# Patient Record
Sex: Male | Born: 1965 | Race: Black or African American | Hispanic: No | Marital: Married | Smoking: Never smoker
Health system: Southern US, Community
[De-identification: ages and names within clinical notes are randomized; demographics above are authoritative.]

## PROBLEM LIST (undated history)

## (undated) DIAGNOSIS — J45909 Unspecified asthma, uncomplicated: Secondary | ICD-10-CM

## (undated) DIAGNOSIS — D869 Sarcoidosis, unspecified: Secondary | ICD-10-CM

## (undated) DIAGNOSIS — M3320 Polymyositis, organ involvement unspecified: Secondary | ICD-10-CM

## (undated) DIAGNOSIS — K219 Gastro-esophageal reflux disease without esophagitis: Secondary | ICD-10-CM

## (undated) DIAGNOSIS — M1990 Unspecified osteoarthritis, unspecified site: Secondary | ICD-10-CM

## (undated) DIAGNOSIS — M359 Systemic involvement of connective tissue, unspecified: Secondary | ICD-10-CM

## (undated) DIAGNOSIS — E781 Pure hyperglyceridemia: Secondary | ICD-10-CM

## (undated) HISTORY — PX: VASECTOMY: SHX75

---

## 2007-06-11 ENCOUNTER — Ambulatory Visit: Payer: Self-pay | Admitting: Internal Medicine

## 2007-12-10 ENCOUNTER — Ambulatory Visit: Payer: Self-pay | Admitting: Internal Medicine

## 2008-01-19 ENCOUNTER — Ambulatory Visit: Payer: Self-pay | Admitting: Internal Medicine

## 2009-03-19 ENCOUNTER — Ambulatory Visit: Payer: Self-pay | Admitting: Family Medicine

## 2012-10-13 ENCOUNTER — Ambulatory Visit: Payer: Self-pay | Admitting: Rheumatology

## 2013-03-08 ENCOUNTER — Ambulatory Visit: Payer: Self-pay

## 2013-11-05 DIAGNOSIS — D869 Sarcoidosis, unspecified: Secondary | ICD-10-CM | POA: Insufficient documentation

## 2016-09-25 DIAGNOSIS — Z01810 Encounter for preprocedural cardiovascular examination: Secondary | ICD-10-CM | POA: Diagnosis not present

## 2016-09-25 DIAGNOSIS — R9431 Abnormal electrocardiogram [ECG] [EKG]: Secondary | ICD-10-CM | POA: Diagnosis not present

## 2016-09-25 DIAGNOSIS — Z01812 Encounter for preprocedural laboratory examination: Secondary | ICD-10-CM | POA: Diagnosis present

## 2016-09-25 DIAGNOSIS — E781 Pure hyperglyceridemia: Secondary | ICD-10-CM | POA: Diagnosis not present

## 2016-09-25 NOTE — Pre-Procedure Instructions (Signed)
REVIEWED PT'S PULMONOLOGY NOTE WITH DR Priscella MannPENWARDEN. NO NEW ORDERS.

## 2016-09-25 NOTE — Patient Instructions (Signed)
Your procedure is scheduled on: Thursday 10/04/15 Report to DAY SURGERY. 2ND FLOOR MEDICAL MALL ENTRANCE. To find out your arrival time please call [phone] between 1PM - 3PM on Wednesday 10/03/15.  Remember: Instructions that are not followed completely may result in serious medical risk, up to and including death, or upon the discretion of your surgeon and anesthesiologist your surgery may need to be rescheduled.    __X__ 1. Do not eat food or drink liquids after midnight. No gum chewing or hard candies.     __X__ 2. No Alcohol for 24 hours before or after surgery.   ____ 3. Bring all medications with you on the day of surgery if instructed.    __X__ 4. Notify your doctor if there is any change in your medical condition     (cold, fever, infections).             ___X__5. No smoking within 24 hours of your surgery.     Do not wear jewelry, make-up, hairpins, clips or nail polish.  Do not wear lotions, powders, or perfumes.   Do not shave 48 hours prior to surgery. Men may shave face and neck.  Do not bring valuables to the hospital.    [facility] is not responsible for any belongings or valuables.               Contacts, dentures or bridgework may not be worn into surgery.  Leave your suitcase in the car. After surgery it may be brought to your room.  For patients admitted to the hospital, discharge time is determined by your                treatment team.   Patients discharged the day of surgery will not be allowed to drive home.   Please read over the following fact sheets that you were given:     __X__ Take these medicines the morning of surgery with A SIP OF WATER:    1. OMEPRAZOLE  2.   3.   4.  5.  6.  ____ Fleet Enema (as directed)   ____ Use CHG Soap as directed  __X__ Use inhalers on the day of surgery  (NEB TX)  ____ Stop metformin 2 days prior to surgery    ____ Take 1/2 of usual insulin dose the night before surgery and none on the morning of surgery.    ____ Stop Coumadin/Plavix/aspirin on   __X__ Stop Anti-inflammatories such as Advil, Aleve, Ibuprofen, Motrin, Naproxen, Naprosyn, Goodies,powder, or aspirin products.  OK to take Tylenol.   ____ Stop supplements until after surgery.    ____ Bring C-Pap to the hospital.

## 2016-09-26 ENCOUNTER — Encounter
Admission: RE | Admit: 2016-09-26 | Discharge: 2016-09-26 | Disposition: A | Discharge status: Home / Self Care | Payer: Commercial Managed Care - HMO | Source: Ambulatory Visit | Attending: Orthopedic Surgery | Admitting: Orthopedic Surgery

## 2016-09-26 ENCOUNTER — Other Ambulatory Visit: Payer: Self-pay

## 2016-09-26 DIAGNOSIS — R9431 Abnormal electrocardiogram [ECG] [EKG]: Secondary | ICD-10-CM | POA: Insufficient documentation

## 2016-09-26 DIAGNOSIS — Z01810 Encounter for preprocedural cardiovascular examination: Secondary | ICD-10-CM | POA: Insufficient documentation

## 2016-09-26 DIAGNOSIS — Z01812 Encounter for preprocedural laboratory examination: Secondary | ICD-10-CM | POA: Insufficient documentation

## 2016-09-26 DIAGNOSIS — E781 Pure hyperglyceridemia: Secondary | ICD-10-CM | POA: Insufficient documentation

## 2016-09-26 LAB — CBC
HCT: 43.5 % (ref 40.0–52.0)
Hemoglobin: 14.8 g/dL (ref 13.0–18.0)
MCH: 31.4 pg (ref 26.0–34.0)
MCHC: 34 g/dL (ref 32.0–36.0)
MCV: 92.3 fL (ref 80.0–100.0)
PLATELETS: 256 10*3/uL (ref 150–440)
RBC: 4.71 MIL/uL (ref 4.40–5.90)
RDW: 13.9 % (ref 11.5–14.5)
WBC: 7.3 10*3/uL (ref 3.8–10.6)

## 2016-09-26 NOTE — Pre-Procedure Instructions (Signed)
Dr. Rosita KeaMenz office notified of cardiac clearance request, faxed and [facility]

## 2016-09-26 NOTE — Pre-Procedure Instructions (Signed)
Dr. Karlton LemonKarenz notified of abnormal pre-op EKG, and patient medical history , cardiac clearance requested.

## 2016-10-18 NOTE — Progress Notes (Signed)
Spoke with Lannette DonathKacie at Dr. Rosita KeaMenz office regarding if they had received cardiac clearance. They had left a message with Dr. Darrold JunkerParaschos office about obtaining the cardiac clearance for this patient.

## 2016-10-23 MED ORDER — CEFAZOLIN SODIUM-DEXTROSE 2-4 GM/100ML-% IV SOLN
2.0000 g | Freq: Once | INTRAVENOUS | Status: AC
  Administered 2016-10-24: 2 g via INTRAVENOUS

## 2016-10-24 ENCOUNTER — Ambulatory Visit: Payer: 59 | Admitting: Anesthesiology

## 2016-10-24 ENCOUNTER — Ambulatory Visit
Admission: RE | Admit: 2016-10-24 | Discharge: 2016-10-24 | Disposition: A | Discharge status: Home / Self Care | Payer: 59 | Source: Ambulatory Visit | Attending: Orthopedic Surgery | Admitting: Orthopedic Surgery

## 2016-10-24 ENCOUNTER — Encounter: Payer: Self-pay | Admitting: *Deleted

## 2016-10-24 ENCOUNTER — Encounter
Admission: RE | Disposition: A | Discharge status: Home / Self Care | Payer: Self-pay | Source: Ambulatory Visit | Attending: Orthopedic Surgery

## 2016-10-24 DIAGNOSIS — M1990 Unspecified osteoarthritis, unspecified site: Secondary | ICD-10-CM | POA: Diagnosis not present

## 2016-10-24 DIAGNOSIS — J45909 Unspecified asthma, uncomplicated: Secondary | ICD-10-CM | POA: Insufficient documentation

## 2016-10-24 DIAGNOSIS — K219 Gastro-esophageal reflux disease without esophagitis: Secondary | ICD-10-CM | POA: Diagnosis not present

## 2016-10-24 DIAGNOSIS — E7800 Pure hypercholesterolemia, unspecified: Secondary | ICD-10-CM | POA: Diagnosis not present

## 2016-10-24 DIAGNOSIS — Z79899 Other long term (current) drug therapy: Secondary | ICD-10-CM | POA: Diagnosis not present

## 2016-10-24 DIAGNOSIS — G5601 Carpal tunnel syndrome, right upper limb: Secondary | ICD-10-CM | POA: Diagnosis not present

## 2016-10-24 DIAGNOSIS — D869 Sarcoidosis, unspecified: Secondary | ICD-10-CM | POA: Diagnosis not present

## 2016-10-24 HISTORY — PX: CARPAL TUNNEL RELEASE: SHX101

## 2016-10-24 HISTORY — DX: Unspecified osteoarthritis, unspecified site: M19.90

## 2016-10-24 HISTORY — DX: Sarcoidosis, unspecified: D86.9

## 2016-10-24 HISTORY — DX: Pure hyperglyceridemia: E78.1

## 2016-10-24 HISTORY — DX: Systemic involvement of connective tissue, unspecified: M35.9

## 2016-10-24 HISTORY — DX: Gastro-esophageal reflux disease without esophagitis: K21.9

## 2016-10-24 HISTORY — DX: Unspecified asthma, uncomplicated: J45.909

## 2016-10-24 SURGERY — CARPAL TUNNEL RELEASE
Anesthesia: General | Site: Wrist | Laterality: Right | Wound class: Clean

## 2016-10-24 MED ORDER — BUPIVACAINE HCL 0.5 % IJ SOLN
INTRAMUSCULAR | Status: DC | PRN
  Administered 2016-10-24: 10 mL

## 2016-10-24 MED ORDER — BUPIVACAINE HCL (PF) 0.5 % IJ SOLN
INTRAMUSCULAR | Status: AC
  Filled 2016-10-24: qty 30

## 2016-10-24 MED ORDER — PROPOFOL 10 MG/ML IV BOLUS
INTRAVENOUS | Status: DC | PRN
  Administered 2016-10-24: 20 mg via INTRAVENOUS
  Administered 2016-10-24: 180 mg via INTRAVENOUS
  Administered 2016-10-24: 20 mg via INTRAVENOUS

## 2016-10-24 MED ORDER — SODIUM CHLORIDE 0.9 % IV SOLN: INTRAVENOUS | Status: DC

## 2016-10-24 MED ORDER — CEFAZOLIN SODIUM-DEXTROSE 2-4 GM/100ML-% IV SOLN
INTRAVENOUS | Status: AC
  Filled 2016-10-24: qty 100

## 2016-10-24 MED ORDER — PROPOFOL 10 MG/ML IV BOLUS
INTRAVENOUS | Status: AC
  Filled 2016-10-24: qty 20

## 2016-10-24 MED ORDER — FENTANYL CITRATE (PF) 100 MCG/2ML IJ SOLN: 25.0000 ug | INTRAMUSCULAR | Status: DC | PRN

## 2016-10-24 MED ORDER — LACTATED RINGERS IV SOLN
INTRAVENOUS | Status: DC
  Administered 2016-10-24 (×3): via INTRAVENOUS

## 2016-10-24 MED ORDER — HYDROCODONE-ACETAMINOPHEN 5-325 MG PO TABS: 1.0000 | ORAL_TABLET | ORAL | Status: DC | PRN

## 2016-10-24 MED ORDER — ONDANSETRON HCL 4 MG PO TABS: 4.0000 mg | ORAL_TABLET | Freq: Four times a day (QID) | ORAL | Status: DC | PRN

## 2016-10-24 MED ORDER — MIDAZOLAM HCL 2 MG/2ML IJ SOLN
INTRAMUSCULAR | Status: DC | PRN
  Administered 2016-10-24: 2 mg via INTRAVENOUS

## 2016-10-24 MED ORDER — PROMETHAZINE HCL 25 MG/ML IJ SOLN: 6.2500 mg | INTRAMUSCULAR | Status: DC | PRN

## 2016-10-24 MED ORDER — METOCLOPRAMIDE HCL 5 MG/ML IJ SOLN: 5.0000 mg | Freq: Three times a day (TID) | INTRAMUSCULAR | Status: DC | PRN

## 2016-10-24 MED ORDER — METOCLOPRAMIDE HCL 10 MG PO TABS: 5.0000 mg | ORAL_TABLET | Freq: Three times a day (TID) | ORAL | Status: DC | PRN

## 2016-10-24 MED ORDER — HYDROCODONE-ACETAMINOPHEN 5-325 MG PO TABS: 1.0000 | ORAL_TABLET | Freq: Four times a day (QID) | ORAL | 0 refills | Status: DC | PRN

## 2016-10-24 MED ORDER — FENTANYL CITRATE (PF) 100 MCG/2ML IJ SOLN
INTRAMUSCULAR | Status: DC | PRN
  Administered 2016-10-24: 50 ug via INTRAVENOUS
  Administered 2016-10-24: 25 ug via INTRAVENOUS

## 2016-10-24 MED ORDER — ONDANSETRON HCL 4 MG/2ML IJ SOLN
INTRAMUSCULAR | Status: DC | PRN
  Administered 2016-10-24: 4 mg via INTRAVENOUS

## 2016-10-24 MED ORDER — MIDAZOLAM HCL 2 MG/2ML IJ SOLN
INTRAMUSCULAR | Status: AC
  Filled 2016-10-24: qty 2

## 2016-10-24 MED ORDER — LIDOCAINE HCL (CARDIAC) 20 MG/ML IV SOLN
INTRAVENOUS | Status: DC | PRN
  Administered 2016-10-24: 60 mg via INTRAVENOUS

## 2016-10-24 MED ORDER — ONDANSETRON HCL 4 MG/2ML IJ SOLN: 4.0000 mg | Freq: Four times a day (QID) | INTRAMUSCULAR | Status: DC | PRN

## 2016-10-24 MED ORDER — FENTANYL CITRATE (PF) 100 MCG/2ML IJ SOLN
INTRAMUSCULAR | Status: AC
  Filled 2016-10-24: qty 2

## 2016-10-24 SURGICAL SUPPLY — 30 items
BANDAGE ACE 3X5.8 VEL STRL LF (GAUZE/BANDAGES/DRESSINGS) ×2 IMPLANT
BNDG ESMARK 4X12 TAN STRL LF (GAUZE/BANDAGES/DRESSINGS) ×2 IMPLANT
CANISTER SUCT 1200ML W/VALVE (MISCELLANEOUS) ×2 IMPLANT
CHLORAPREP W/TINT 26ML (MISCELLANEOUS) ×2 IMPLANT
CUFF TOURN 18 STER (MISCELLANEOUS) IMPLANT
ELECT CAUTERY NDL 2.0 MIC (NEEDLE) IMPLANT
ELECT CAUTERY NEEDLE 2.0 MIC (NEEDLE) IMPLANT
ELECT CAUTERY NEEDLE TIP 1.0 (MISCELLANEOUS)
ELECTRODE CAUTERY NEDL TIP 1.0 (MISCELLANEOUS) IMPLANT
GAUZE PETRO XEROFOAM 1X8 (MISCELLANEOUS) ×2 IMPLANT
GAUZE SPONGE 4X4 12PLY STRL (GAUZE/BANDAGES/DRESSINGS) ×2 IMPLANT
GLOVE BIOGEL PI IND STRL 9 (GLOVE) ×1 IMPLANT
GLOVE BIOGEL PI INDICATOR 9 (GLOVE) ×1
GLOVE SURG SYN 9.0  PF PI (GLOVE) ×1
GLOVE SURG SYN 9.0 PF PI (GLOVE) ×1 IMPLANT
GOWN SRG 2XL LVL 4 RGLN SLV (GOWNS) ×1 IMPLANT
GOWN STRL NON-REIN 2XL LVL4 (GOWNS) ×2
GOWN STRL REUS W/ TWL LRG LVL3 (GOWN DISPOSABLE) ×1 IMPLANT
GOWN STRL REUS W/TWL 2XL LVL3 (GOWN DISPOSABLE) ×2 IMPLANT
GOWN STRL REUS W/TWL LRG LVL3 (GOWN DISPOSABLE) ×2
KIT RM TURNOVER STRD PROC AR (KITS) ×2 IMPLANT
NS IRRIG 500ML POUR BTL (IV SOLUTION) ×2 IMPLANT
PACK EXTREMITY ARMC (MISCELLANEOUS) ×2 IMPLANT
PAD CAST CTTN 4X4 STRL (SOFTGOODS) ×1 IMPLANT
PADDING CAST COTTON 4X4 STRL (SOFTGOODS) ×2
STOCKINETTE 48X4 2 PLY STRL (GAUZE/BANDAGES/DRESSINGS) ×1 IMPLANT
STOCKINETTE STRL 4IN 9604848 (GAUZE/BANDAGES/DRESSINGS) ×2 IMPLANT
SUT ETHILON 4-0 (SUTURE) ×2
SUT ETHILON 4-0 FS2 18XMFL BLK (SUTURE) ×1
SUTURE ETHLN 4-0 FS2 18XMF BLK (SUTURE) ×1 IMPLANT

## 2016-10-24 NOTE — H&P (Signed)
Reviewed paper H+P, will be scanned into chart. No changes noted.  

## 2016-10-24 NOTE — Op Note (Signed)
10/24/2016  8:07 AM  PATIENT:  Michael Bussingavid Chieffo Jr.  51 y.o. male  PRE-OPERATIVE DIAGNOSIS:  CARPAL TUNNEL SYNDROME RIGHT  POST-OPERATIVE DIAGNOSIS:  CARPAL TUNNEL SYNDROME RIGHT  PROCEDURE:  Procedure(s): CARPAL TUNNEL RELEASE (Right)  SURGEON: Leitha SchullerMichael J Damare Serano, MD  ASSISTANTS: None  ANESTHESIA:   general  EBL:  Total I/O In: 450 [I.V.:450] Out: -   BLOOD ADMINISTERED:none  DRAINS: none   LOCAL MEDICATIONS USED:  MARCAINE     SPECIMEN:  No Specimen  DISPOSITION OF SPECIMEN:  PATHOLOGY  COUNTS:  YES  TOURNIQUET:   16 minutes at 250 mmHg, forearm tourniquet  IMPLANTS: None  DICTATION: .Dragon Dictation patient was brought the operating room and after adequate general anesthesia was obtained the right arm was prepped and draped in sterile fashion. A tourniquet was applied to the upper forearm and the arm was prepped and draped in sterile fashion. Appropriate patient identification and timeout procedures were completed and tourniquet was raised to 250 mmHg. A volar incision was made in line with ring metacarpal approximate 2 and half centimeters in length going across the flexion crease secondary to compression being proximal to the flexion crease. The transverse carpal ligament was identified there is a very large thenar muscle accessory muscle that covered almost the whole carpal tunnel this was elevated off the carpal tunnel and the ligament released from distal to proximal proximally it was brought centimeter proximal to the wrist flexion crease where there is hourglass compression after release of this under direct visualization the nerve appeared to have good vascular blush there was slight constriction at that level as well. The nerve appeared intact there is mild flexor tenosynovitis no masses present. The wound was then irrigated and local anesthetic given for postop analgesia and the substantial tissue followed by skin closure with simple interrupted 4-0 nylon skin  closure  PLAN OF CARE: Discharge to home after PACU  PATIENT DISPOSITION:  PACU - hemodynamically stable.

## 2016-10-24 NOTE — Anesthesia Preprocedure Evaluation (Signed)
Anesthesia Evaluation  Patient identified by MRN, date of birth, ID band Patient awake    Reviewed: Allergy & Precautions, H&P , NPO status , Patient's Chart, lab work & pertinent test results, reviewed documented beta blocker date and time   History of Anesthesia Complications Negative for: history of anesthetic complications  Airway Mallampati: III  TM Distance: >3 FB Neck ROM: full    Dental no notable dental hx. (+) Caps   Pulmonary shortness of breath and with exertion, asthma , neg sleep apnea, neg COPD, neg recent URI,  Sarcoidosis          Cardiovascular Exercise Tolerance: Good negative cardio ROS       Neuro/Psych negative neurological ROS  negative psych ROS   GI/Hepatic Neg liver ROS, GERD  Medicated,  Endo/Other  negative endocrine ROS  Renal/GU negative Renal ROS  negative genitourinary   Musculoskeletal   Abdominal   Peds  Hematology negative hematology ROS (+)   Anesthesia Other Findings Past Medical History: No date: Arthritis No date: Connective tissue disease (HCC) No date: GERD (gastroesophageal reflux disease) No date: Hypertriglyceridemia No date: Reactive airway disease No date: Sarcoidosis (HCC)   Reproductive/Obstetrics negative OB ROS                             Anesthesia Physical Anesthesia Plan  ASA: II  Anesthesia Plan: General   Post-op Pain Management:    Induction:   Airway Management Planned:   Additional Equipment:   Intra-op Plan:   Post-operative Plan:   Informed Consent: I have reviewed the patients History and Physical, chart, labs and discussed the procedure including the risks, benefits and alternatives for the proposed anesthesia with the patient or authorized representative who has indicated his/her understanding and acceptance.   Dental Advisory Given  Plan Discussed with: Anesthesiologist, CRNA and Surgeon  Anesthesia  Plan Comments:         Anesthesia Quick Evaluation

## 2016-10-24 NOTE — Transfer of Care (Signed)
Immediate Anesthesia Transfer of Care Note  Patient: Michael BussingDavid Pudwill Jr.  Procedure(s) Performed: Procedure(s): CARPAL TUNNEL RELEASE (Right)  Patient Location: PACU  Anesthesia Type:General  Level of Consciousness: sedated  Airway & Oxygen Therapy: Patient Spontanous Breathing and Patient connected to face mask oxygen  Post-op Assessment: Report given to RN and Post -op Vital signs reviewed and stable  Post vital signs: Reviewed and stable  Last Vitals:  Vitals:   10/24/16 0606 10/24/16 0810  BP: (!) 164/102 (!) 146/99  Pulse: 83 79  Resp: 18 (!) 23  Temp: 36.8 C 36.8 C    Last Pain:  Vitals:   10/24/16 0606  TempSrc: Oral         Complications: No apparent anesthesia complications

## 2016-10-24 NOTE — Anesthesia Procedure Notes (Signed)
Procedure Name: LMA Insertion Date/Time: 10/24/2016 7:30 AM Performed by: Henrietta HooverPOPE, Adriana Quinby Pre-anesthesia Checklist: Patient identified, Emergency Drugs available, Suction available, Patient being monitored and Timeout performed Patient Re-evaluated:Patient Re-evaluated prior to inductionOxygen Delivery Method: Circle system utilized Preoxygenation: Pre-oxygenation with 100% oxygen Intubation Type: IV induction Ventilation: Mask ventilation without difficulty LMA: LMA inserted LMA Size: 5.0 Number of attempts: 1 Placement Confirmation: positive ETCO2 and breath sounds checked- equal and bilateral Tube secured with: Tape Dental Injury: Teeth and Oropharynx as per pre-operative assessment

## 2016-10-24 NOTE — Discharge Instructions (Addendum)
Loosen Ace wrap if fingers swell. Work on finger motion. Pain medicine as directed. Keep dressing clean and dry.    AMBULATORY SURGERY  DISCHARGE INSTRUCTIONS   1) The drugs that you were given will stay in your system until tomorrow so for the next 24 hours you should not:  A) Drive an automobile B) Make any legal decisions C) Drink any alcoholic beverage   2) You may resume regular meals tomorrow.  Today it is better to start with liquids and gradually work up to solid foods.  You may eat anything you prefer, but it is better to start with liquids, then soup and crackers, and gradually work up to solid foods.   3) Please notify your doctor immediately if you have any unusual bleeding, trouble breathing, redness and pain at the surgery site, drainage, fever, or pain not relieved by medication.  Please contact your physician with any problems or Same Day Surgery at [phone], Monday through Friday 6 am to 4 pm, or [place] at [facility] Main number at [phone].  Carpal Tunnel Release, Care After Refer to this sheet in the next few weeks. These instructions provide you with information about caring for yourself after your procedure. Your health care provider may also give you more specific instructions. Your treatment has been planned according to current medical practices, but problems sometimes occur. Call your health care provider if you have any problems or questions after your procedure. What can I expect after the procedure? After your procedure, it is typical to have the following:  Pain.  Numbness.  Tingling.  Swelling.  Stiffness.  Bruising. Follow these instructions at home:  Take medicines only as directed by your health care provider.  There are many different ways to close and cover an incision, including stitches (sutures), skin glue, and adhesive strips. Follow your health care provider's instructions about:  Incision care.  Bandage (dressing)  changes and removal.  Incision closure removal.  Wear a splint or a brace as directed by your surgeon. You may need to do this for 2-3 weeks.  Keep your hand raised (elevated) above the level of your heart while you are resting. Move your fingers often.  Avoid activities that cause hand pain.  Ask your surgeon when you can start to do all of your usual activities again, such as:  Driving.  Returning to work.  Bathing and swimming.  Keep all follow-up visits as directed by your health care provider. This is important. You may need physical therapy for several months to speed healing and regain movement. Contact a health care provider if:  You have drainage, redness, swelling, or pain at your incision site.  You have a fever.  You have chills.  Your pain medicine is not working.  Your symptoms do not go away after 2 months.  Your symptoms go away and then return. Get help right away if:  You have pain or numbness that is getting worse.  Your fingers change color.  You are not able to move your fingers. This information is not intended to replace advice given to you by your health care provider. Make sure you discuss any questions you have with your health care provider. Document Released: 04/05/2005 Document Revised: 02/22/2016 Document Reviewed: 05/04/2014 Elsevier Interactive Patient Education  2017 [employer]

## 2016-10-24 NOTE — Anesthesia Post-op Follow-up Note (Cosign Needed)
Anesthesia QCDR form completed.        

## 2016-10-25 NOTE — Anesthesia Postprocedure Evaluation (Signed)
Anesthesia Post Note  Patient: Rogue BussingDavid Mulrooney Jr.  Procedure(s) Performed: Procedure(s) (LRB): CARPAL TUNNEL RELEASE (Right)  Patient location during evaluation: PACU Anesthesia Type: General Level of consciousness: awake and alert Pain management: pain level controlled Vital Signs Assessment: post-procedure vital signs reviewed and stable Respiratory status: spontaneous breathing, nonlabored ventilation, respiratory function stable and patient connected to nasal cannula oxygen Cardiovascular status: blood pressure returned to baseline and stable Postop Assessment: no signs of nausea or vomiting Anesthetic complications: no     Last Vitals:  Vitals:   10/24/16 0931 10/24/16 0953  BP: (!) 145/96 (!) 151/98  Pulse: 71 74  Resp: 18 18  Temp:      Last Pain:  Vitals:   10/24/16 0953  TempSrc:   PainSc: 0-No pain                 Lenard SimmerAndrew Marshon Bangs

## 2016-10-31 ENCOUNTER — Other Ambulatory Visit: Payer: Self-pay

## 2016-11-21 ENCOUNTER — Encounter: Admission: RE | Payer: Self-pay | Source: Ambulatory Visit

## 2016-11-21 ENCOUNTER — Ambulatory Visit
Admission: RE | Admit: 2016-11-21 | Payer: Commercial Managed Care - HMO | Source: Ambulatory Visit | Admitting: Orthopedic Surgery

## 2016-11-21 SURGERY — CARPAL TUNNEL RELEASE
Anesthesia: Choice | Laterality: Left

## 2017-02-06 ENCOUNTER — Encounter: Payer: Self-pay | Admitting: Podiatry

## 2017-02-06 ENCOUNTER — Ambulatory Visit (INDEPENDENT_AMBULATORY_CARE_PROVIDER_SITE_OTHER): Payer: Commercial Managed Care - HMO | Admitting: Podiatry

## 2017-02-06 ENCOUNTER — Ambulatory Visit (INDEPENDENT_AMBULATORY_CARE_PROVIDER_SITE_OTHER): Payer: Commercial Managed Care - HMO

## 2017-02-06 DIAGNOSIS — M79672 Pain in left foot: Secondary | ICD-10-CM

## 2017-02-06 DIAGNOSIS — M722 Plantar fascial fibromatosis: Secondary | ICD-10-CM | POA: Diagnosis not present

## 2017-02-06 DIAGNOSIS — M79671 Pain in right foot: Secondary | ICD-10-CM

## 2017-02-06 MED ORDER — TRIAMCINOLONE ACETONIDE 10 MG/ML IJ SUSP: 10.0000 mg | Freq: Once | INTRAMUSCULAR | Status: DC

## 2017-02-06 NOTE — Progress Notes (Signed)
Subjective:    Patient ID: Michael Bussingavid Cerrato Jr., male   DOB: 51 y.o.   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       Objective:  Physical Exam  Cardiovascular: Intact distal pulses.   Musculoskeletal: Normal range of motion.  Neurological: He is alert.  Skin: Skin is warm.  Nursing note and vitals reviewed.  neurovascular status intact muscle strength adequate range of motion within normal limits with patient found to have exquisite discomfort in the right plantar fascia at the insertion calcaneus and on the left there is a small 3 x 3 mm nodule in the plantar distal arch that's nonpainful with no color change or other pathology. He does have moderate depression of the arch and is found to have good digital perfusion and well oriented 3     Assessment:   Acute plantar fasciitis right with inflammation with probable small cyst left foot that's not changed at all and is not painful      Plan:    H&P and both conditions were discussed. At this point I injected the plantar fascia right 3 mg Kenalog 5 mill grams Xylocaine instructed on physical therapy heat ice therapy and I went ahead and made him a deep orthotic with a heel of at least 12 mm to provide for functional support of the collapsed arch bilateral and take stress off his feet. Patient will have these dispensed in approximately 3 weeks and also had a fascial brace dispensed with instructions on usage  X-rays indicated no spur formation currently with moderate depression of the arch and no arthritis noted

## 2017-02-06 NOTE — Patient Instructions (Signed)

## 2017-02-27 ENCOUNTER — Ambulatory Visit: Payer: Commercial Managed Care - HMO | Admitting: Podiatry

## 2017-02-28 ENCOUNTER — Ambulatory Visit (INDEPENDENT_AMBULATORY_CARE_PROVIDER_SITE_OTHER): Payer: Commercial Managed Care - HMO | Admitting: Podiatry

## 2017-02-28 ENCOUNTER — Encounter: Payer: Self-pay | Admitting: Podiatry

## 2017-02-28 DIAGNOSIS — M722 Plantar fascial fibromatosis: Secondary | ICD-10-CM

## 2017-02-28 MED ORDER — TRIAMCINOLONE ACETONIDE 10 MG/ML IJ SUSP
10.0000 mg | Freq: Once | INTRAMUSCULAR | Status: AC
  Administered 2017-02-28: 10 mg

## 2017-02-28 NOTE — Patient Instructions (Signed)

## 2017-02-28 NOTE — Progress Notes (Signed)
Subjective:    Patient ID: Michael Bussingavid Cadet Jr., male   DOB: 51 y.o.   MRN: [id number]   HPI patient states he still having trouble with his heel when walking but it has improved some and the orthotics feel good    ROS      Objective:  Physical Exam  Neurovascular status intact muscle strength adequate with patient's right heel quite improved with inflammation still noted around the medial band    Assessment:    Plantar fasciitis right improved but present     Plan:    Reinjected the plantar fascia right 3 mg Kenalog 5 mg Xylocaine dispensed orthotics with all instructions on usage

## 2020-01-29 ENCOUNTER — Encounter (HOSPITAL_COMMUNITY): Payer: Self-pay | Admitting: Physician Assistant

## 2020-01-29 ENCOUNTER — Ambulatory Visit (HOSPITAL_COMMUNITY)
Admission: EM | Admit: 2020-01-29 | Discharge: 2020-01-29 | Disposition: A | Discharge status: Home / Self Care | Payer: 59 | Attending: Physician Assistant | Admitting: Physician Assistant

## 2020-01-29 ENCOUNTER — Other Ambulatory Visit: Payer: Self-pay

## 2020-01-29 DIAGNOSIS — L03031 Cellulitis of right toe: Secondary | ICD-10-CM

## 2020-01-29 MED ORDER — SULFAMETHOXAZOLE-TRIMETHOPRIM 800-160 MG PO TABS: 1.0000 | ORAL_TABLET | Freq: Two times a day (BID) | ORAL | 0 refills | Status: AC

## 2020-01-29 NOTE — Discharge Instructions (Addendum)
Take medication as prescribed.

## 2020-01-29 NOTE — ED Triage Notes (Signed)
Ingrown toenail to R foot

## 2020-01-29 NOTE — ED Provider Notes (Signed)
[place]    CSN: [id number] Arrival date & time: 01/29/20  1006      History   Chief Complaint Chief Complaint  Patient presents with  . Ingrown Toenail    HPI Michael Alexander. is a 54 y.o. male.   Here c/w "ingrown toenail" R great toe x 1 week.  He had pedicure done 2 weeks ago, noted swelling shortly after. Admits pain, tenderness, denies erythema, discharge.  He has not taken any medication for this.     Past Medical History:  Diagnosis Date  . Arthritis   . Connective tissue disease ([place])   . GERD (gastroesophageal reflux disease)   . Hypertriglyceridemia   . Reactive airway disease   . Sarcoidosis     There are no problems to display for this patient.   Past Surgical History:  Procedure Laterality Date  . CARPAL TUNNEL RELEASE Right 10/24/2016   Procedure: CARPAL TUNNEL RELEASE;  Surgeon: Hessie Knows, MD;  Location: [facility] ORS;  Service: Orthopedics;  Laterality: Right;  Marland Kitchen VASECTOMY         Home Medications    Prior to Admission medications   Medication Sig Start Date End Date Taking? Authorizing Provider  albuterol (PROVENTIL HFA;VENTOLIN HFA) 108 (90 Base) MCG/ACT inhaler Inhale 1-2 puffs into the lungs every 6 (six) hours as needed for wheezing or shortness of breath.    [provider]  Aspirin-Salicylamide-Caffeine (BC HEADACHE POWDER PO) Take 1 packet by mouth daily as needed (headache).    [provider]  Doxylamine Succinate, Sleep, (SLEEP AID PO) Take 2 tablets by mouth at bedtime as needed (sleep).    [provider]  HYDROcodone-acetaminophen (NORCO) 5-325 MG tablet Take 1-2 tablets by mouth every 6 (six) hours as needed for moderate pain. 10/24/16   Hessie Knows, MD  omeprazole (PRILOSEC) 20 MG capsule Take 20 mg by mouth daily.    [provider]  predniSONE (DELTASONE) 5 MG tablet Take 5-20 mg by mouth daily with breakfast. Take 20 mgs daily for 7 days, then 15mg s daily for 7 days, then 10mg s  for 7 days, then 5mg s daily for 7 days    [provider]  sulfamethoxazole-trimethoprim (BACTRIM DS) 800-160 MG tablet Take 1 tablet by mouth 2 (two) times daily for 10 days. 01/29/20 02/08/20  Peri Jefferson, PA-C    Family History History reviewed. No pertinent family history.  Social History Social History   Tobacco Use  . Smoking status: Never Smoker  . Smokeless tobacco: Never Used  Substance Use Topics  . Alcohol use: Yes    Comment: occ  . Drug use: No     Allergies   Patient has no known allergies.   Review of Systems Review of Systems  Constitutional: Negative for chills, fatigue and fever.  Gastrointestinal: Negative for abdominal pain, nausea and vomiting.  Musculoskeletal: Positive for gait problem. Negative for arthralgias, back pain and joint swelling.  Skin: Negative for color change, rash and wound.  Neurological: Negative for weakness, numbness and headaches.  Hematological: Negative for adenopathy. Does not bruise/bleed easily.  Psychiatric/Behavioral: Negative for confusion and sleep disturbance.  All other systems reviewed and are negative.    Physical Exam Triage Vital Signs ED Triage Vitals [01/29/20 1030]  Enc Vitals Group     BP (!) 166/106     Pulse Rate 87     Resp 16     Temp 98.5 F (36.9 C)     Temp Source Oral  SpO2 99 %     Weight      Height      Head Circumference      Peak Flow      Pain Score 5     Pain Loc      Pain Edu?      Excl. in GC?    No data found.  Updated Vital Signs BP (!) 166/106   Pulse 87   Temp 98.5 F (36.9 C) (Oral)   Resp 16   SpO2 99%   Visual Acuity Right Eye Distance:   Left Eye Distance:   Bilateral Distance:    Right Eye Near:   Left Eye Near:    Bilateral Near:     Physical Exam Vitals and nursing note reviewed.  Constitutional:      General: He is not in acute distress.    Appearance: Normal appearance. He is well-developed. He is not ill-appearing or  toxic-appearing.  HENT:     Head: Normocephalic and atraumatic.     Nose: Nose normal. No congestion.  Eyes:     General: No scleral icterus.    Conjunctiva/sclera: Conjunctivae normal.  Pulmonary:     Effort: Pulmonary effort is normal. No respiratory distress.  Musculoskeletal:     Cervical back: Normal range of motion and neck supple. No rigidity.     Right foot: Normal range of motion. Swelling and tenderness present. No deformity, laceration or bony tenderness. Normal pulse.     Comments: 5 mm x 10 mm firm, tender nodule lateral aspect R great toenail, no erythema, no purulent d/c.    Skin:    General: Skin is warm and dry.     Capillary Refill: Capillary refill takes less than 2 seconds.  Neurological:     General: No focal deficit present.     Mental Status: He is alert and oriented to person, place, and time.     Cranial Nerves: No cranial nerve deficit.     Gait: Gait abnormal (antalgic, favoring L foot).  Psychiatric:        Mood and Affect: Mood normal.        Behavior: Behavior normal.      UC Treatments / Results  Labs (all labs ordered are listed, but only abnormal results are displayed) Labs Reviewed - No data to display  EKG   Radiology No results found.  Procedures Procedures (including critical care time)  Medications Ordered in UC Medications - No data to display  Initial Impression / Assessment and Plan / UC Course  I have reviewed the triage vital signs and the nursing notes.  Pertinent labs & imaging results that were available during my care of the patient were reviewed by me and considered in my medical decision making (see chart for details).     Paronychia too small to lance, will treat with bactrim and advise warm compress on toe. Return PRN if no improvement. Final Clinical Impressions(s) / UC Diagnoses   Final diagnoses:  Paronychia of great toe, right     Discharge Instructions     Take medication as prescribed.    ED  Prescriptions    Medication Sig Dispense Auth. Provider   sulfamethoxazole-trimethoprim (BACTRIM DS) 800-160 MG tablet Take 1 tablet by mouth 2 (two) times daily for 10 days. 20 tablet Evern Core, PA-C     PDMP not reviewed this encounter.   Evern Core, PA-C 01/29/20 1052

## 2020-07-28 DIAGNOSIS — J9859 Other diseases of mediastinum, not elsewhere classified: Secondary | ICD-10-CM | POA: Insufficient documentation

## 2021-02-01 DIAGNOSIS — I2694 Multiple subsegmental pulmonary emboli without acute cor pulmonale: Secondary | ICD-10-CM | POA: Diagnosis present

## 2021-02-05 DIAGNOSIS — J8489 Other specified interstitial pulmonary diseases: Secondary | ICD-10-CM | POA: Insufficient documentation

## 2021-02-10 ENCOUNTER — Emergency Department (HOSPITAL_COMMUNITY): Payer: 59

## 2021-02-10 ENCOUNTER — Other Ambulatory Visit: Payer: Self-pay

## 2021-02-10 ENCOUNTER — Encounter (HOSPITAL_COMMUNITY): Payer: Self-pay | Admitting: Emergency Medicine

## 2021-02-10 ENCOUNTER — Emergency Department (HOSPITAL_COMMUNITY)
Admission: EM | Admit: 2021-02-10 | Discharge: 2021-02-10 | Disposition: A | Discharge status: Home / Self Care | Payer: 59 | Attending: Emergency Medicine | Admitting: Emergency Medicine

## 2021-02-10 DIAGNOSIS — Z79899 Other long term (current) drug therapy: Secondary | ICD-10-CM | POA: Diagnosis not present

## 2021-02-10 DIAGNOSIS — R0781 Pleurodynia: Secondary | ICD-10-CM

## 2021-02-10 DIAGNOSIS — Z7901 Long term (current) use of anticoagulants: Secondary | ICD-10-CM | POA: Insufficient documentation

## 2021-02-10 DIAGNOSIS — I2694 Multiple subsegmental pulmonary emboli without acute cor pulmonale: Secondary | ICD-10-CM | POA: Insufficient documentation

## 2021-02-10 LAB — BASIC METABOLIC PANEL
Anion gap: 6 (ref 5–15)
BUN: 11 mg/dL (ref 6–20)
CO2: 29 mmol/L (ref 22–32)
Calcium: 9.3 mg/dL (ref 8.9–10.3)
Chloride: 103 mmol/L (ref 98–111)
Creatinine, Ser: 1.03 mg/dL (ref 0.61–1.24)
GFR, Estimated: >60 mL/min (ref >60)
Glucose, Bld: 115 mg/dL — ABNORMAL HIGH (ref 70–99)
Potassium: 3.8 mmol/L (ref 3.5–5.1)
Sodium: 138 mmol/L (ref 135–145)

## 2021-02-10 LAB — CBC
HCT: 43.4 % (ref 39.0–52.0)
Hemoglobin: 14.6 g/dL (ref 13.0–17.0)
MCH: 31.3 pg (ref 26.0–34.0)
MCHC: 33.6 g/dL (ref 30.0–36.0)
MCV: 93.1 fL (ref 80.0–100.0)
Platelets: 300 10*3/uL (ref 150–400)
RBC: 4.66 MIL/uL (ref 4.22–5.81)
RDW: 12.8 % (ref 11.5–15.5)
WBC: 10.3 10*3/uL (ref 4.0–10.5)
nRBC: 0 % (ref 0.0–0.2)

## 2021-02-10 LAB — TROPONIN I (HIGH SENSITIVITY): Troponin I (High Sensitivity): 3 ng/L (ref <18)

## 2021-02-10 MED ORDER — LIDOCAINE VISCOUS HCL 2 % MT SOLN
15.0000 mL | Freq: Once | OROMUCOSAL | Status: AC
  Administered 2021-02-10: 15 mL via ORAL
  Filled 2021-02-10: qty 15

## 2021-02-10 MED ORDER — ALUM & MAG HYDROXIDE-SIMETH 200-200-20 MG/5ML PO SUSP
30.0000 mL | Freq: Once | ORAL | Status: AC
  Administered 2021-02-10: 30 mL via ORAL
  Filled 2021-02-10: qty 30

## 2021-02-10 NOTE — ED Triage Notes (Incomplete)
Pt reports generalized chest pain worse with chest pain, onset last night. Pt was dx with right PE one week ago and has been taking eliquis since then. Denies shob. Hx of sarcoidosis.

## 2021-02-10 NOTE — ED Provider Notes (Signed)
[facility] EMERGENCY DEPARTMENT Provider Note   CSN: [id number] Arrival date & time: 02/10/21  1041     History Chief Complaint  Patient presents with  . Chest Pain    Michael Alexander. is a 55 y.o. 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  The patient was diagnosed with multiple subsegmental pulmonary emboli on 01/31/2021 at [facility].  He did not have any evidence of cor pulmonale.  Patient was started on Eliquis and has been doing well.  Last night he began having pain which was worse with deep breathing rated at 5 out of 10.  This is new since his diagnosis.  He denies any increased shortness of breath.  He is also been having feelings of gas and feeling like he needs to belch repeatedly.  He was wondering if his pain might be related to gas however because of his recent diagnosis of pulmonary emboli came to the emergency department for further evaluation.  He denies any nausea, vomiting, diaphoresis.  He has no known history of CAD.  HPI     Past Medical History:  Diagnosis Date  . Arthritis   . Connective tissue disease (HCC)   . GERD (gastroesophageal reflux disease)   . Hypertriglyceridemia   . Reactive airway disease   . Sarcoidosis     There are no problems to display for this patient.   Past Surgical History:  Procedure Laterality Date  . CARPAL TUNNEL RELEASE Right 10/24/2016   Procedure: CARPAL TUNNEL RELEASE;  Surgeon: Kennedy Bucker, MD;  Location: [facility] ORS;  Service: Orthopedics;  Laterality: Right;  Marland Kitchen VASECTOMY         No family history on file.  Social History   Tobacco Use  . Smoking status: Never Smoker  . Smokeless tobacco: Never Used  Substance Use Topics  . Alcohol use: Yes    Comment: occ  . Drug use: No    Home Medications Prior to Admission medications   Medication Sig Start Date End Date Taking? Authorizing Provider  albuterol  (PROVENTIL HFA;VENTOLIN HFA) 108 (90 Base) MCG/ACT inhaler Inhale 1-2 puffs into the lungs every 6 (six) hours as needed for wheezing or shortness of breath.   Yes [provider]  apixaban (ELIQUIS) 5 MG TABS tablet Take 5 mg by mouth 2 (two) times daily. 02/08/21  Yes [provider]  fluticasone-salmeterol (ADVAIR) 500-50 MCG/ACT AEPB Inhale 1 puff into the lungs 2 (two) times daily. 01/20/20  Yes [provider]  omeprazole (PRILOSEC) 20 MG capsule Take 20 mg by mouth daily.   Yes [provider]  HYDROcodone-acetaminophen (NORCO) 5-325 MG tablet Take 1-2 tablets by mouth every 6 (six) hours as needed for moderate pain. Patient not taking: No sig reported 10/24/16   Kennedy Bucker, MD    Allergies    Patient has no known allergies.  Review of Systems   Review of Systems Ten systems reviewed and are negative for acute change, except as noted in the HPI.   Physical Exam Updated Vital Signs BP (!) 145/98   Pulse 90   Temp 98 F (36.7 C)   Resp 19   Ht 5\' 8"  (1.727 m)   Wt 96.2 kg   SpO2 98%   BMI 32.23 kg/m   Physical Exam Vitals and nursing note reviewed.  Constitutional:      General: He is not in acute distress.    Appearance: He is  well-developed. He is not diaphoretic.  HENT:     Head: Normocephalic and atraumatic.  Eyes:     General: No scleral icterus.    Conjunctiva/sclera: Conjunctivae normal.  Cardiovascular:     Rate and Rhythm: Normal rate and regular rhythm.     Heart sounds: Normal heart sounds.  Pulmonary:     Effort: Pulmonary effort is normal. No respiratory distress.     Breath sounds: Normal breath sounds.  Abdominal:     Palpations: Abdomen is soft.     Tenderness: There is no abdominal tenderness.  Musculoskeletal:     Cervical back: Normal range of motion and neck supple.  Skin:    General: Skin is warm and dry.  Neurological:     Mental Status: He is alert.  Psychiatric:        Behavior: Behavior normal.      ED Results / Procedures / Treatments   Labs (all labs ordered are listed, but only abnormal results are displayed) Labs Reviewed  BASIC METABOLIC PANEL - Abnormal; Notable for the following components:      Result Value   Glucose, Bld 115 (*)    All other components within normal limits  CBC  TROPONIN I (HIGH SENSITIVITY)    EKG EKG Interpretation  Date/Time:  Saturday Feb 10 2021 10:43:38 EDT Ventricular Rate:  86 PR Interval:  142 QRS Duration: 80 QT Interval:  364 QTC Calculation: 435 R Axis:   -58 Text Interpretation: Normal sinus rhythm Left axis deviation Inferior infarct , age undetermined Anterior infarct , age undetermined Abnormal ECG Confirmed by Marianna Fuss ([id number]) on 02/10/2021 12:42:54 PM   Radiology DG Chest 2 View  Result Date: 02/10/2021 CLINICAL DATA:  Pt c/o mid CP and SOB since last night. Pt denies any other symptoms Nonsmoker EXAM: CHEST - 2 VIEW COMPARISON:  12/10/2007 FINDINGS: Coarse fibrotic changes of both lung bases. No definite superimposed infiltrate or edema. Heart size and mediastinal contours are within normal limits. No effusion.  No pneumothorax. Visualized bones unremarkable. IMPRESSION: Interstitial lung disease without definite acute or superimposed abnormality Electronically Signed   By: Corlis Leak M.D.   On: 02/10/2021 11:45    Procedures Procedures   Medications Ordered in ED Medications  alum & mag hydroxide-simeth (MAALOX/MYLANTA) 200-200-20 MG/5ML suspension 30 mL (30 mLs Oral Given 02/10/21 1223)    And  lidocaine (XYLOCAINE) 2 % viscous mouth solution 15 mL (15 mLs Oral Given 02/10/21 1223)    ED Course  I have reviewed the triage vital signs and the nursing notes.  Pertinent labs & imaging results that were available during my care of the patient were reviewed by me and considered in my medical decision making (see chart for details).    MDM Rules/Calculators/A&P                         55 year old male here  with known pulmonary emboli.  He had an increase in his pleuritic chest pain but is on anticoagulation therapy and is compliant with medications.  Other than mild hypertension the patient has excellent vital signs which are within normal limits without tachypnea, tachycardia.  I ordered and reviewed labs that include BMP which shows a slightly elevated blood glucose of insignificant value CBC is otherwise within normal limit with a normal troponin.  I repeated a chest x-ray which shows evidence of his residual lung disease but no other acute abnormalities. EKG shows sinus rhythm at a rate of  86.  Patient seen and shared visit with Dr. Stevie Kern.  In shared decision-making with the patient, his wife do not feel that the patient needs repeat imaging at this time given the fact that he is on appropriate treatment and there is no evidence of significant increase in his work of breathing, his vital signs and no elevation in his troponin suggesting heart strain.  The patient has no exertional dyspnea.  At this time we feel that he is safe to follow very closely with his pulmonologist and cardiologist this coming week at [facility] with very strict return precautions.  The patient and his wife seem reliable to return for any change in condition of concern.  He appears otherwise appropriate for discharge at this time.  This patient was evaluated during a time of global shortage of iodinated contrast media. Based on guidance from the [employer] of Radiology, best practices, and local institutional approaches an alternative path for evaluating and managing the patient may have been employed in order to provide optimal care during this shortage. The current situation has been discussed with the patient. Final Clinical Impression(s) / ED Diagnoses Final diagnoses:  Pleuritic chest pain  Multiple subsegmental pulmonary emboli without acute cor pulmonale [facility])    Rx / DC Orders ED Discharge Orders    None        Arthor Captain, PA-C 02/10/21 1315    Milagros Loll, MD 02/12/21 [phone]

## 2021-02-10 NOTE — ED Triage Notes (Signed)
Upper chest pain, SHOB and indegestion this am; no relief with TUMS. Was seen at [facility] on 5/4 and dx with PE. Takes eliquis.

## 2021-02-10 NOTE — Discharge Instructions (Addendum)
Get help right away if you: Have: New or increased pain, swelling, warmth, or redness in an arm or leg. Shortness of breath that gets worse during activity or at rest. A fever. Worsening chest pain. A rapid or irregular heartbeat. A severe headache. Vision changes. A serious fall or accident, or you hit your head. Stomach pain. Blood in your vomit, stool, or urine. A cut that will not stop bleeding. Cough up blood. Feel light-headed or dizzy, and that feeling does not go away. Cannot move your arms or legs. Are confused or have memory loss.

## 2021-08-02 DIAGNOSIS — D689 Coagulation defect, unspecified: Secondary | ICD-10-CM | POA: Insufficient documentation

## 2021-12-20 ENCOUNTER — Encounter (HOSPITAL_COMMUNITY): Payer: Self-pay | Admitting: Pharmacy Technician

## 2021-12-20 ENCOUNTER — Other Ambulatory Visit: Payer: Self-pay

## 2021-12-20 ENCOUNTER — Encounter (HOSPITAL_COMMUNITY): Payer: Self-pay | Admitting: *Deleted

## 2021-12-20 ENCOUNTER — Emergency Department (HOSPITAL_COMMUNITY): Payer: 59

## 2021-12-20 ENCOUNTER — Emergency Department (HOSPITAL_COMMUNITY)
Admission: EM | Admit: 2021-12-20 | Discharge: 2021-12-20 | Disposition: A | Discharge status: Home / Self Care | Payer: 59 | Attending: Emergency Medicine | Admitting: Emergency Medicine

## 2021-12-20 ENCOUNTER — Ambulatory Visit (INDEPENDENT_AMBULATORY_CARE_PROVIDER_SITE_OTHER): Payer: 59

## 2021-12-20 ENCOUNTER — Ambulatory Visit (HOSPITAL_COMMUNITY)
Admission: EM | Admit: 2021-12-20 | Discharge: 2021-12-20 | Disposition: A | Discharge status: Home / Self Care | Payer: 59 | Source: Home / Self Care

## 2021-12-20 DIAGNOSIS — R051 Acute cough: Secondary | ICD-10-CM | POA: Insufficient documentation

## 2021-12-20 DIAGNOSIS — J189 Pneumonia, unspecified organism: Secondary | ICD-10-CM | POA: Diagnosis not present

## 2021-12-20 DIAGNOSIS — R059 Cough, unspecified: Secondary | ICD-10-CM | POA: Diagnosis not present

## 2021-12-20 DIAGNOSIS — R000 Tachycardia, unspecified: Secondary | ICD-10-CM | POA: Diagnosis not present

## 2021-12-20 DIAGNOSIS — Z20822 Contact with and (suspected) exposure to covid-19: Secondary | ICD-10-CM | POA: Insufficient documentation

## 2021-12-20 DIAGNOSIS — Z7901 Long term (current) use of anticoagulants: Secondary | ICD-10-CM | POA: Insufficient documentation

## 2021-12-20 DIAGNOSIS — D869 Sarcoidosis, unspecified: Secondary | ICD-10-CM | POA: Insufficient documentation

## 2021-12-20 DIAGNOSIS — Z86711 Personal history of pulmonary embolism: Secondary | ICD-10-CM | POA: Insufficient documentation

## 2021-12-20 DIAGNOSIS — R0602 Shortness of breath: Secondary | ICD-10-CM

## 2021-12-20 LAB — BASIC METABOLIC PANEL
Anion gap: 5 (ref 5–15)
BUN: 6 mg/dL (ref 6–20)
CO2: 28 mmol/L (ref 22–32)
Calcium: 9.2 mg/dL (ref 8.9–10.3)
Chloride: 105 mmol/L (ref 98–111)
Creatinine, Ser: 1.03 mg/dL (ref 0.61–1.24)
GFR, Estimated: >60 mL/min (ref >60)
Glucose, Bld: 140 mg/dL — ABNORMAL HIGH (ref 70–99)
Potassium: 3.6 mmol/L (ref 3.5–5.1)
Sodium: 138 mmol/L (ref 135–145)

## 2021-12-20 LAB — CBC
HCT: 43.5 % (ref 39.0–52.0)
Hemoglobin: 14.6 g/dL (ref 13.0–17.0)
MCH: 31.3 pg (ref 26.0–34.0)
MCHC: 33.6 g/dL (ref 30.0–36.0)
MCV: 93.3 fL (ref 80.0–100.0)
Platelets: 296 10*3/uL (ref 150–400)
RBC: 4.66 MIL/uL (ref 4.22–5.81)
RDW: 13.3 % (ref 11.5–15.5)
WBC: 8.2 10*3/uL (ref 4.0–10.5)
nRBC: 0 % (ref 0.0–0.2)

## 2021-12-20 LAB — POC INFLUENZA A AND B ANTIGEN (URGENT CARE ONLY)
INFLUENZA A ANTIGEN, POC: NEGATIVE
INFLUENZA B ANTIGEN, POC: NEGATIVE

## 2021-12-20 MED ORDER — DOXYCYCLINE HYCLATE 100 MG PO CAPS: 100.0000 mg | ORAL_CAPSULE | Freq: Two times a day (BID) | ORAL | 0 refills | Status: DC

## 2021-12-20 MED ORDER — METHYLPREDNISOLONE SODIUM SUCC 125 MG IJ SOLR
125.0000 mg | Freq: Once | INTRAMUSCULAR | Status: AC
  Administered 2021-12-20: 125 mg via INTRAMUSCULAR

## 2021-12-20 MED ORDER — IOHEXOL 350 MG/ML SOLN
65.0000 mL | Freq: Once | INTRAVENOUS | Status: AC | PRN
  Administered 2021-12-20: 65 mL via INTRAVENOUS

## 2021-12-20 MED ORDER — METHYLPREDNISOLONE SODIUM SUCC 125 MG IJ SOLR
INTRAMUSCULAR | Status: AC
  Filled 2021-12-20: qty 2

## 2021-12-20 MED ORDER — ALBUTEROL SULFATE HFA 108 (90 BASE) MCG/ACT IN AERS
2.0000 | INHALATION_SPRAY | RESPIRATORY_TRACT | 1 refills | Status: AC | PRN
Start: 2021-12-20 — End: ?

## 2021-12-20 NOTE — ED Notes (Signed)
Patient is being discharged from the Urgent Care and sent to the Emergency Department via POV . Per Leath-Warren-, Lorene Dy NP patient is in need of higher level of care due to further evaluation. Patient is aware and verbalizes understanding of plan of care.  ?Vitals:  ? 12/20/21 1036 12/20/21 1121  ?BP: (!) 146/91 (!) 146/91  ?Pulse:  88  ?Resp:  20  ?Temp:  98.7 ?F (37.1 ?C)  ?SpO2:  98%  ? ? ?

## 2021-12-20 NOTE — Discharge Instructions (Addendum)
Go to the [facility], ER for further evaluation.  They will be able to access our records, including the chest x-ray and EKG that were performed today. ?

## 2021-12-20 NOTE — ED Provider Triage Note (Signed)
Emergency Medicine Provider Triage Evaluation Note ? ?Briscoe Deutscher. , a 56 y.o. male  was evaluated in triage.  Pt complains of gradual onset, constant, cough for the past 1.5 weeks.  Reports that he began having some bloody tinged sputum earlier today.  He went to urgent care who performed a chest x-ray which did show a 10 mm nodular density in the right lung.  Recommended contrast-enhanced CT scan for further eval.  Patient does have history of PE.  Currently on Eliquis for same.  Denies missing any doses.  Denies any chest pain or shortness of breath.. ? ?Review of Systems  ?Positive: + cough, blood tinged sputum ?Negative: - SOB ? ?Physical Exam  ?BP (!) 167/107 (BP Location: Right Arm)   Pulse (!) 104   Temp 98.9 ?F (37.2 ?C) (Oral)   Resp (!) 22   SpO2 99%  ?Gen:   Awake, no distress   ?Resp:  Normal effort  ?MSK:   Moves extremities without difficulty  ?Other:   ? ?Medical Decision Making  ?Medically screening exam initiated at 1:28 PM.  Appropriate orders placed.  Briscoe Deutscher. was informed that the remainder of the evaluation will be completed by another provider, this initial triage assessment does not replace that evaluation, and the importance of remaining in the ED until their evaluation is complete. ? ? ?  ?Eustaquio Maize, PA-C ?12/20/21 1329 ? ?

## 2021-12-20 NOTE — ED Provider Notes (Signed)
?[facility] ? ? ? ?CSN: [id number] ?Arrival date & time: 12/20/21  [id number] ? ? ?  ? ?History   ?Chief Complaint ?Chief Complaint  ?Patient presents with  ? Cough  ? ? ?HPI ?Michael BussingDavid Caison Jr. is a 56 y.o. male.  ? ?The patient is a 56 year old male who presents for shortness of breath.  Patient states approximately 1 week ago, he came down with what he thought was the flu.  He developed chills, and a cough at that time.  He also states that he felt his symptoms were exacerbated by his allergies.  The patient states that over the last several days, he has developed worsening shortness of breath.  He states this morning he coughed up blood-tinged sputum.  The patient does have a history of PE, that occurred last year.  He is currently on blood thinners.  He also has a history of sarcoid.  States that he has been using his albuterol inhaler and nebulizer more frequently over the past several days.  He does admit to shortness of breath when talking, on exertion.  He denies chest pain, or inability to speak in a complete sentence.  He states that he [facility] does [facility].  He states he tried to get an appointment, but he cannot be seen until next week. ? ? ?Cough ?Cough characteristics:  Productive ?Sputum characteristics:  Bloody ?Severity:  Moderate ?Progression:  Worsening ?Smoker: no   ?Context: upper respiratory infection   ?Relieved by:  Home nebulizer ?Worsened by:  Deep breathing, activity and lying down ?Associated symptoms: shortness of breath and wheezing   ?Associated symptoms: no chest pain, no fever, no rhinorrhea, no sinus congestion and no sore throat   ? ?Past Medical History:  ?Diagnosis Date  ? Arthritis   ? Connective tissue disease (HCC)   ? GERD (gastroesophageal reflux disease)   ? Hypertriglyceridemia   ? Reactive airway disease   ? Sarcoidosis   ? ? ?There are no problems to display for this patient. ? ? ?Past Surgical History:  ?Procedure Laterality Date  ? CARPAL  TUNNEL RELEASE Right 10/24/2016  ? Procedure: CARPAL TUNNEL RELEASE;  Surgeon: Kennedy BuckerMichael Menz, MD;  Location: [facility] ORS;  Service: Orthopedics;  Laterality: Right;  ? VASECTOMY    ? ? ? ? ? ?Home Medications   ? ?Prior to Admission medications   ?Medication Sig Start Date End Date Taking? Authorizing Provider  ?albuterol (PROVENTIL HFA;VENTOLIN HFA) 108 (90 Base) MCG/ACT inhaler Inhale 1-2 puffs into the lungs every 6 (six) hours as needed for wheezing or shortness of breath.    [provider]  ?apixaban (ELIQUIS) 5 MG TABS tablet Take 5 mg by mouth 2 (two) times daily. 02/08/21   [provider]  ?fluticasone-salmeterol (ADVAIR) 500-50 MCG/ACT AEPB Inhale 1 puff into the lungs 2 (two) times daily. 01/20/20   [provider]  ?HYDROcodone-acetaminophen (NORCO) 5-325 MG tablet Take 1-2 tablets by mouth every 6 (six) hours as needed for moderate pain. ?Patient not taking: No sig reported 10/24/16   Kennedy BuckerMenz, Michael, MD  ?omeprazole (PRILOSEC) 20 MG capsule Take 20 mg by mouth daily.    [provider]  ? ? ?Family History ?History reviewed. No pertinent family history. ? ?Social History ?Social History  ? ?Tobacco Use  ? Smoking status: Never  ? Smokeless tobacco: Never  ?Substance Use Topics  ? Alcohol use: Yes  ?  Comment: occ  ? Drug use: No  ? ? ? ?Allergies   ?  Patient has no known allergies. ? ? ?Review of Systems ?Review of Systems  ?Constitutional:  Positive for fatigue. Negative for fever.  ?HENT:  Negative for rhinorrhea and sore throat.   ?Respiratory:  Positive for cough, shortness of breath and wheezing.   ?Cardiovascular:  Negative for chest pain.  ?Gastrointestinal: Negative.   ?Skin: Negative.   ?Psychiatric/Behavioral: Negative.    ? ? ?Physical Exam ?Triage Vital Signs ?ED Triage Vitals  ?Enc Vitals Group  ?   BP 12/20/21 1036 (!) 146/91  ?   Pulse --   ?   Resp --   ?   Temp --   ?   Temp src --   ?   SpO2 --   ?   Weight --   ?   Height --   ?   Head Circumference --   ?    Peak Flow --   ?   Pain Score 12/20/21 1034 0  ?   Pain Loc --   ?   Pain Edu? --   ?   Excl. in GC? --   ? ?No data found. ? ?Updated Vital Signs ?BP (!) 146/91   Pulse 88   Temp 98.7 ?F (37.1 ?C)   Resp 20   SpO2 98%  ? ?Visual Acuity ?Right Eye Distance:   ?Left Eye Distance:   ?Bilateral Distance:   ? ?Right Eye Near:   ?Left Eye Near:    ?Bilateral Near:    ? ?Physical Exam ?Constitutional:   ?   General: He is not in acute distress. ?   Appearance: Normal appearance.  ?HENT:  ?   Head: Normocephalic and atraumatic.  ?   Right Ear: Tympanic membrane, ear canal and external ear normal.  ?   Left Ear: Tympanic membrane, ear canal and external ear normal.  ?   Nose: Congestion present.  ?   Mouth/Throat:  ?   Mouth: Mucous membranes are moist.  ?   Pharynx: Posterior oropharyngeal erythema present. No oropharyngeal exudate.  ?Eyes:  ?   Extraocular Movements: Extraocular movements intact.  ?   Conjunctiva/sclera: Conjunctivae normal.  ?   Pupils: Pupils are equal, round, and reactive to light.  ?Pulmonary:  ?   Effort: Pulmonary effort is normal. No respiratory distress.  ?Abdominal:  ?   General: Bowel sounds are normal.  ?   Palpations: Abdomen is soft.  ?   Tenderness: There is no abdominal tenderness.  ?Musculoskeletal:  ?   Cervical back: Normal range of motion and neck supple.  ?Skin: ?   General: Skin is warm and dry.  ?   Capillary Refill: Capillary refill takes less than 2 seconds.  ?Neurological:  ?   General: No focal deficit present.  ?   Mental Status: He is alert and oriented to person, place, and time.  ?Psychiatric:     ?   Mood and Affect: Mood normal.     ?   Behavior: Behavior normal.  ? ? ? ?UC Treatments / Results  ?Labs ?(all labs ordered are listed, but only abnormal results are displayed) ?Labs Reviewed  ?SARS CORONAVIRUS 2 (TAT 6-24 HRS)  ?POC INFLUENZA A AND B ANTIGEN (URGENT CARE ONLY)  ? ? ?EKG ?NSR, Left axis deviation, inferior infarct, age undetermined, anterior infarct, age  undetermined. Abnormal ECG. Compared to EKG performed 02/12/21. No changes noted.  ? ?Radiology ?DG Chest 2 View ? ?Result Date: 12/20/2021 ?CLINICAL DATA:  cough and SOB EXAM: CHEST - 2  VIEW COMPARISON:  02/10/2021 FINDINGS: Cardiomediastinal silhouette and pulmonary vasculature are within normal limits. Increased interstitial opacities again seen at the lung base. The given stability since prior examination this is most consistent with scarring. Interval development of 10 mm nodular opacity at the right lateral lung base. Degenerative changes seen in the lower cervical spine. IMPRESSION: 1. No acute cardiopulmonary process. 2. Unchanged linear opacities at the lung bases most consistent with scarring. 3. Interval development of 10 mm nodular opacity at the right lateral lung base. Further evaluation with contrast enhanced chest CT should be performed. These results will be called to the ordering clinician or representative by the Radiologist Assistant, and communication documented in the PACS or [employer]. Electronically Signed   By: Acquanetta Belling M.D.   On: 12/20/2021 10:57   ? ?Procedures ?Procedures (including critical care time) ? ?Medications Ordered in UC ?Medications  ?methylPREDNISolone sodium succinate (SOLU-MEDROL) 125 mg/2 mL injection 125 mg (has no administration in time range)  ? ? ?Initial Impression / Assessment and Plan / UC Course  ?I have reviewed the triage vital signs and the nursing notes. ? ?Pertinent labs & imaging results that were available during my care of the patient were reviewed by me and considered in my medical decision making (see chart for details). ? ?The patient is a 56 year old male who presents with worsening cough and shortness of breath over the last week.  Patient's symptoms started with flulike symptoms, but cough has since worsened.  The patient has a history of sarcoid and PE.  He currently takes Eliquis.  Today, he states he coughed up blood-tinged sputum.  He is  concerned because he is having to use his albuterol inhaler and nebulizer treatment frequently.  Chest x-ray was performed, which showed a new interval development of a 10 mm opacity.  Radiologist recommended the

## 2021-12-20 NOTE — Discharge Instructions (Signed)
1.  There was no evidence of a blood clot in your lungs on your CT scan today.  You do need to review the results of your CT scan with your pulmonologist to discuss long-term management and surveillance of chronic appearing conditions and a small nodule.  Continue your other medications including your Xarelto as prescribed. ?2.  Start taking doxycycline as prescribed.  Continue use your albuterol inhaler as needed. ?3.  Return to emergency department immediately if you have worsening shortness of breath, get a fever and chest pain or other concerning symptoms. ?

## 2021-12-20 NOTE — ED Provider Notes (Signed)
?[facility] EMERGENCY DEPARTMENT ?Provider Note ? ? ?CSN: [id number] ?Arrival date & time: 12/20/21  1250 ? ?  ? ?History ? ?No chief complaint on file. ? ? ?Michael Coor. is a 56 y.o. male. ? ?HPI ?Patient is sent from urgent care for further evaluation by CT scan.  There was a concerning finding on chest x-ray with radiology recommendation for follow-up CT.  She has history of sarcoidosis.  He has had a harsh cough for a week.  This morning he started coughing up some blood.  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  No lower extremity swelling or calf pain throughout the course of this.  He has felt somewhat short of breath.  He denies chest pain.  He has not had any antibiotic therapy during the course of this illness.  Has used his albuterol inhaler more frequently over the past week. ?  ? ?Home Medications ?Prior to Admission medications   ?Medication Sig Start Date End Date Taking? Authorizing Provider  ?acetaminophen (TYLENOL) 325 MG tablet Take 325-650 mg by mouth every 6 (six) hours as needed for headache or mild pain.   Yes [provider]  ?albuterol (PROVENTIL HFA;VENTOLIN HFA) 108 (90 Base) MCG/ACT inhaler Inhale 2 puffs into the lungs every 6 (six) hours as needed for wheezing or shortness of breath.   Yes [provider]  ?albuterol (PROVENTIL) (2.5 MG/3ML) 0.083% nebulizer solution Take 2.5 mg by nebulization every 6 (six) hours as needed for wheezing or shortness of breath.   Yes [provider]  ?albuterol (VENTOLIN HFA) 108 (90 Base) MCG/ACT inhaler Inhale 2 puffs into the lungs every 4 (four) hours as needed for wheezing or shortness of breath. 12/20/21  Yes Delona Clasby, Lebron Conners, MD  ?Aspirin-Salicylamide-Caffeine (BC HEADACHE POWDER PO) Take 1 packet by mouth 2 (two) times daily as needed (for headaches or pain).   Yes [provider]  ?doxycycline (VIBRAMYCIN) 100 MG capsule Take 1 capsule (100 mg total) by mouth 2  (two) times daily. One po bid x 7 days 12/20/21  Yes Denson Niccoli, Lebron Conners, MD  ?fluticasone-salmeterol (ADVAIR) 500-50 MCG/ACT AEPB Inhale 1 puff into the lungs at bedtime. 01/20/20  Yes [provider]  ?NON FORMULARY Take 30-120 mLs by mouth See admin instructions. Ginger root/turmeric/lemon juice: Juice and drink 30-120 ml's by mouth daily   Yes [provider]  ?PRILOSEC OTC 20 MG tablet Take 20 mg by mouth at bedtime.   Yes [provider]  ?rivaroxaban (XARELTO) 10 MG TABS tablet Take 10 mg by mouth at bedtime.   Yes [provider]  ?HYDROcodone-acetaminophen (NORCO) 5-325 MG tablet Take 1-2 tablets by mouth every 6 (six) hours as needed for moderate pain. ?Patient not taking: Reported on 12/20/2021 10/24/16   Kennedy Bucker, MD  ?   ? ?Allergies    ?Patient has no known allergies.   ? ?Review of Systems   ?Review of Systems ?10 systems reviewed and negative except as per HPI ?Physical Exam ?Updated Vital Signs ?BP (!) 136/103   Pulse 95   Temp 98.9 ?F (37.2 ?C) (Oral)   Resp (!) 22   SpO2 99%  ?Physical Exam ?Constitutional:   ?   Appearance: Normal appearance.  ?HENT:  ?   Mouth/Throat:  ?   Pharynx: Oropharynx is clear.  ?Eyes:  ?   Extraocular Movements: Extraocular movements intact.  ?Cardiovascular:  ?   Rate and Rhythm: Regular rhythm. Tachycardia present.  ?Pulmonary:  ?  Comments: No respiratory distress intermittent harsh paroxysmal cough.  Lungs grossly clear with an occasional crackle in the midlung field ?Abdominal:  ?   General: There is no distension.  ?   Palpations: Abdomen is soft.  ?   Tenderness: There is no abdominal tenderness. There is no guarding.  ?Musculoskeletal:     ?   General: No swelling or tenderness. Normal range of motion.  ?   Right lower leg: No edema.  ?   Left lower leg: No edema.  ?Skin: ?   General: Skin is warm and dry.  ?Neurological:  ?   General: No focal deficit present.  ?   Mental Status: He is alert and oriented to person, place,  and time.  ?   Coordination: Coordination normal.  ?Psychiatric:     ?   Mood and Affect: Mood normal.  ? ? ?ED Results / Procedures / Treatments   ?Labs ?(all labs ordered are listed, but only abnormal results are displayed) ?Labs Reviewed  ?BASIC METABOLIC PANEL - Abnormal; Notable for the following components:  ?    Result Value  ? Glucose, Bld 140 (*)   ? All other components within normal limits  ?CBC  ? ? ?EKG ?EKG Interpretation ? ?Date/Time:  Thursday December 20 2021 13:02:09 EDT ?Ventricular Rate:  80 ?PR Interval:  138 ?QRS Duration: 78 ?QT Interval:  362 ?QTC Calculation: 417 ?R Axis:   -48 ?Text Interpretation: Normal sinus rhythm Left axis deviation Inferior infarct , age undetermined Anterior infarct , age undetermined Abnormal ECG When compared with ECG of 20-Dec-2021 11:34, PREVIOUS ECG IS PRESENT when compared to prior, similar appearance. No STEMI Confirmed by Theda Belfast ([id number]) on 12/20/2021 2:37:44 PM ? ?Radiology ?DG Chest 2 View ? ?Result Date: 12/20/2021 ?CLINICAL DATA:  cough and SOB EXAM: CHEST - 2 VIEW COMPARISON:  02/10/2021 FINDINGS: Cardiomediastinal silhouette and pulmonary vasculature are within normal limits. Increased interstitial opacities again seen at the lung base. The given stability since prior examination this is most consistent with scarring. Interval development of 10 mm nodular opacity at the right lateral lung base. Degenerative changes seen in the lower cervical spine. IMPRESSION: 1. No acute cardiopulmonary process. 2. Unchanged linear opacities at the lung bases most consistent with scarring. 3. Interval development of 10 mm nodular opacity at the right lateral lung base. Further evaluation with contrast enhanced chest CT should be performed. These results will be called to the ordering clinician or representative by the Radiologist Assistant, and communication documented in the PACS or [employer]. Electronically Signed   By: Acquanetta Belling M.D.   On: 12/20/2021  10:57  ? ?CT Angio Chest PE W/Cm &/Or Wo Cm ? ?Result Date: 12/20/2021 ?CLINICAL DATA:  Pulmonary embolism (PE) suspected, high prob EXAM: CT ANGIOGRAPHY CHEST WITH CONTRAST TECHNIQUE: Multidetector CT imaging of the chest was performed using the standard protocol during bolus administration of intravenous contrast. Multiplanar CT image reconstructions and MIPs were obtained to evaluate the vascular anatomy. RADIATION DOSE REDUCTION: This exam was performed according to the departmental dose-optimization program which includes automated exposure control, adjustment of the mA and/or kV according to patient size and/or use of iterative reconstruction technique. CONTRAST:  18mL OMNIPAQUE IOHEXOL 350 MG/ML SOLN COMPARISON:  None. FINDINGS: Cardiovascular: Satisfactory opacification of the pulmonary arteries to the level of the segmental branches in the upper lobes, with poor opacification of the distal lower lobe segmental and subsegmental vessels related to respiratory motion artifact and pulmonary fibrosis. There is no evidence  of pulmonary embolism within the well opacified vessels. Normal cardiac size. No pericardial disease. Mild coronary artery calcifications. Mediastinum/Nodes: Mildly prominent hilar lymph nodes bilaterally, likely reactive. There is a low-density mediastinal mass measuring 4.3 x 1.9 x 5.5 cm (series 6, image 112, sagittal image 112). This is consistent with a thymic cyst. Lungs/Pleura: There is there is a basilar predominant fibrotic lung disease with cystic changes and peribronchovascular ground-glass opacities. No definite honeycombing. Probable traction bronchiectasis. There are is paraseptal emphysema/mild fibrotic changes anteriorly in the upper lobes. There is a focal nodular opacity within the right basilar fibrosis measuring 6 mm (series 7, image 73). No pleural effusion. No pneumothorax. Upper Abdomen: No acute findings. Musculoskeletal: No acute osseous abnormality. No suspicious lytic  or blastic lesions. There is a vertebral body hemangioma of the T9 vertebral body. Review of the MIP images confirms the above findings. IMPRESSION: No evidence of pulmonary embolism, within the limitation of p

## 2021-12-20 NOTE — ED Notes (Signed)
Got patient into a gown on the monitor patient is resting with family at bedside and call bell in reach 

## 2021-12-20 NOTE — ED Triage Notes (Addendum)
Pt reports he flu like Sx's last week and still has a cough. Pt observed to be [facility] on arrival to room with a SPO2 of 98 % on room air . Pt does not use O2 at home. Pt reports he is more SHOB after coughing. Pt does have a neb machine and HHN. Pt has increased usage of neb and HHN. ?

## 2021-12-20 NOTE — ED Triage Notes (Addendum)
Pt sent here from [facility] for CT of his chest. Pt reports some shob and blood in his sputum when he coughs. Pt with hx PE, currently anticoagulated.  ?

## 2021-12-21 LAB — SARS CORONAVIRUS 2 (TAT 6-24 HRS): SARS Coronavirus 2: NEGATIVE

## 2022-11-10 ENCOUNTER — Emergency Department (HOSPITAL_COMMUNITY): Payer: 59

## 2022-11-10 ENCOUNTER — Observation Stay (HOSPITAL_COMMUNITY)
Admission: EM | Admit: 2022-11-10 | Discharge: 2022-11-11 | Disposition: A | Discharge status: Home / Self Care | Payer: 59 | Attending: Internal Medicine | Admitting: Internal Medicine

## 2022-11-10 ENCOUNTER — Other Ambulatory Visit: Payer: Self-pay

## 2022-11-10 ENCOUNTER — Encounter (HOSPITAL_COMMUNITY): Payer: Self-pay | Admitting: Emergency Medicine

## 2022-11-10 DIAGNOSIS — D869 Sarcoidosis, unspecified: Secondary | ICD-10-CM

## 2022-11-10 DIAGNOSIS — J849 Interstitial pulmonary disease, unspecified: Secondary | ICD-10-CM | POA: Insufficient documentation

## 2022-11-10 DIAGNOSIS — R0602 Shortness of breath: Secondary | ICD-10-CM | POA: Diagnosis present

## 2022-11-10 DIAGNOSIS — R071 Chest pain on breathing: Secondary | ICD-10-CM

## 2022-11-10 DIAGNOSIS — I2699 Other pulmonary embolism without acute cor pulmonale: Secondary | ICD-10-CM

## 2022-11-10 DIAGNOSIS — Z7982 Long term (current) use of aspirin: Secondary | ICD-10-CM | POA: Insufficient documentation

## 2022-11-10 DIAGNOSIS — K219 Gastro-esophageal reflux disease without esophagitis: Secondary | ICD-10-CM | POA: Insufficient documentation

## 2022-11-10 DIAGNOSIS — Z79899 Other long term (current) drug therapy: Secondary | ICD-10-CM | POA: Insufficient documentation

## 2022-11-10 DIAGNOSIS — I2694 Multiple subsegmental pulmonary emboli without acute cor pulmonale: Secondary | ICD-10-CM | POA: Diagnosis present

## 2022-11-10 DIAGNOSIS — I2609 Other pulmonary embolism with acute cor pulmonale: Secondary | ICD-10-CM | POA: Diagnosis not present

## 2022-11-10 DIAGNOSIS — Z7901 Long term (current) use of anticoagulants: Secondary | ICD-10-CM | POA: Diagnosis not present

## 2022-11-10 HISTORY — DX: Polymyositis, organ involvement unspecified: M33.20

## 2022-11-10 HISTORY — DX: Other pulmonary embolism without acute cor pulmonale: I26.99

## 2022-11-10 LAB — COMPREHENSIVE METABOLIC PANEL
ALT: 15 U/L (ref 0–44)
AST: 21 U/L (ref 15–41)
Albumin: 3.9 g/dL (ref 3.5–5.0)
Alkaline Phosphatase: 78 U/L (ref 38–126)
Anion gap: 10 (ref 5–15)
BUN: 7 mg/dL (ref 6–20)
CO2: 25 mmol/L (ref 22–32)
Calcium: 9.4 mg/dL (ref 8.9–10.3)
Chloride: 102 mmol/L (ref 98–111)
Creatinine, Ser: 0.97 mg/dL (ref 0.61–1.24)
GFR, Estimated: >60 mL/min (ref >60)
Glucose, Bld: 90 mg/dL (ref 70–99)
Potassium: 3.6 mmol/L (ref 3.5–5.1)
Sodium: 137 mmol/L (ref 135–145)
Total Bilirubin: 0.5 mg/dL (ref 0.3–1.2)
Total Protein: 7.5 g/dL (ref 6.5–8.1)

## 2022-11-10 LAB — CBC
HCT: 46.3 % (ref 39.0–52.0)
Hemoglobin: 15.4 g/dL (ref 13.0–17.0)
MCH: 31.6 pg (ref 26.0–34.0)
MCHC: 33.3 g/dL (ref 30.0–36.0)
MCV: 94.9 fL (ref 80.0–100.0)
Platelets: 302 10*3/uL (ref 150–400)
RBC: 4.88 MIL/uL (ref 4.22–5.81)
RDW: 13 % (ref 11.5–15.5)
WBC: 7.9 10*3/uL (ref 4.0–10.5)
nRBC: 0 % (ref 0.0–0.2)

## 2022-11-10 LAB — APTT: aPTT: 174 seconds (ref 24–36)

## 2022-11-10 LAB — TROPONIN I (HIGH SENSITIVITY)
Troponin I (High Sensitivity): 4 ng/L (ref <18)
Troponin I (High Sensitivity): 3 ng/L (ref <18)

## 2022-11-10 LAB — HEPARIN LEVEL (UNFRACTIONATED): Heparin Unfractionated: >1.1 IU/mL — ABNORMAL HIGH (ref 0.30–0.70)

## 2022-11-10 LAB — ANTITHROMBIN III: AntiThromb III Func: 113 % (ref 75–120)

## 2022-11-10 LAB — SEDIMENTATION RATE: Sed Rate: 10 mm/hr (ref 0–16)

## 2022-11-10 MED ORDER — SODIUM CHLORIDE 0.9% FLUSH
3.0000 mL | Freq: Two times a day (BID) | INTRAVENOUS | Status: DC
  Administered 2022-11-10: 3 mL via INTRAVENOUS

## 2022-11-10 MED ORDER — SODIUM CHLORIDE 0.9 % IV SOLN: 250.0000 mL | INTRAVENOUS | Status: DC | PRN

## 2022-11-10 MED ORDER — ACETAMINOPHEN 325 MG PO TABS: 325.0000 mg | ORAL_TABLET | Freq: Four times a day (QID) | ORAL | Status: DC | PRN

## 2022-11-10 MED ORDER — HEPARIN (PORCINE) 25000 UT/250ML-% IV SOLN
1200.0000 [IU]/h | INTRAVENOUS | Status: DC
  Administered 2022-11-10: 1500 [IU]/h via INTRAVENOUS
  Administered 2022-11-11: 1350 [IU]/h via INTRAVENOUS
  Filled 2022-11-10 (×2): qty 250

## 2022-11-10 MED ORDER — MOMETASONE FURO-FORMOTEROL FUM 200-5 MCG/ACT IN AERO
2.0000 | INHALATION_SPRAY | Freq: Two times a day (BID) | RESPIRATORY_TRACT | Status: DC
  Administered 2022-11-10 – 2022-11-11 (×2): 2 via RESPIRATORY_TRACT
  Filled 2022-11-10: qty 8.8

## 2022-11-10 MED ORDER — IOHEXOL 350 MG/ML SOLN
75.0000 mL | Freq: Once | INTRAVENOUS | Status: AC | PRN
  Administered 2022-11-10: 75 mL via INTRAVENOUS

## 2022-11-10 MED ORDER — RIVAROXABAN 10 MG PO TABS: 10.0000 mg | ORAL_TABLET | Freq: Every day | ORAL | Status: DC

## 2022-11-10 MED ORDER — HEPARIN BOLUS VIA INFUSION
5000.0000 [IU] | Freq: Once | INTRAVENOUS | Status: AC
  Administered 2022-11-10: 5000 [IU] via INTRAVENOUS
  Filled 2022-11-10: qty 5000

## 2022-11-10 MED ORDER — OMEPRAZOLE 20 MG PO CPDR
20.0000 mg | DELAYED_RELEASE_CAPSULE | Freq: Every day | ORAL | Status: DC
  Administered 2022-11-10: 20 mg via ORAL
  Filled 2022-11-10 (×2): qty 1

## 2022-11-10 MED ORDER — ALBUTEROL SULFATE (2.5 MG/3ML) 0.083% IN NEBU: 2.5000 mg | INHALATION_SOLUTION | Freq: Four times a day (QID) | RESPIRATORY_TRACT | Status: DC | PRN

## 2022-11-10 MED ORDER — HYDRALAZINE HCL 20 MG/ML IJ SOLN: 2.0000 mg | Freq: Four times a day (QID) | INTRAMUSCULAR | Status: DC | PRN

## 2022-11-10 MED ORDER — ALBUTEROL SULFATE HFA 108 (90 BASE) MCG/ACT IN AERS: 2.0000 | INHALATION_SPRAY | RESPIRATORY_TRACT | Status: DC | PRN

## 2022-11-10 MED ORDER — TRAZODONE HCL 50 MG PO TABS: 25.0000 mg | ORAL_TABLET | Freq: Every evening | ORAL | Status: DC | PRN

## 2022-11-10 MED ORDER — SODIUM CHLORIDE 0.9% FLUSH: 3.0000 mL | INTRAVENOUS | Status: DC | PRN

## 2022-11-10 NOTE — Assessment & Plan Note (Signed)
No active complaints. Decreased intake due to dental issues not GI issues  Plan Continue PPI tx

## 2022-11-10 NOTE — Assessment & Plan Note (Signed)
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  Plan Continue home regimen

## 2022-11-10 NOTE — Progress Notes (Addendum)
Date and time results received:  11/10/22 11/10/2022 1854  Test: aptt  Critical Value: 174  Name of Provider Notified: Norins, MD  Orders Received? Or Actions Taken?: Pharmacy notified, see pharmacy note, per MD order.  Daymon Larsen, RN

## 2022-11-10 NOTE — Progress Notes (Addendum)
ANTICOAGULATION CONSULT NOTE - Initial Consult  Pharmacy Consult for heparin Indication: pulmonary embolus  No Known Allergies  Patient Measurements: Height: 5' 8"$  (172.7 cm) Weight: 84.4 kg (186 lb) IBW/kg (Calculated) : 68.4 Heparin Dosing Weight: TBW  Vital Signs: Temp: 97.8 F (36.6 C) (02/11 0818) Temp Source: Oral (02/11 0818) BP: 155/99 (02/11 0930) Pulse Rate: 58 (02/11 0930)  Labs: Recent Labs    11/10/22 0753  HGB 15.4  HCT 46.3  PLT 302  CREATININE 0.97  TROPONINIHS 4    Estimated Creatinine Clearance: 90 mL/min (by C-G formula based on SCr of 0.97 mg/dL).   Medical History: Past Medical History:  Diagnosis Date   Arthritis    Connective tissue disease ([place])    GERD (gastroesophageal reflux disease)    Hypertriglyceridemia    Reactive airway disease    Sarcoidosis     Assessment: Michael Alexander presenting with CP and SOB, CT angio chest with PE, hx of PE, has been on Xarelto in past but missed some doses recently then restarted (20m once daily dosing), last dose taken 2/10 PM  Goal of Therapy:  Heparin level 0.3-0.7 units/ml aPTT 66-102 seconds Monitor platelets by anticoagulation protocol: Yes   Plan:  Heparin 5000 units IV x 1, and gtt at 1500 units/hr F/u 6 hour aPTT and Anti-Xa level  JBertis Ruddy PharmD, BBensonPharmacist ED Pharmacist Phone # [phone]/08/2023 11:01 AM

## 2022-11-10 NOTE — ED Provider Notes (Addendum)
[facility] Provider Note   CSN: [id number] Arrival date & time: 11/10/22  0741     History  Chief Complaint  Patient presents with   Chest Pain   Shortness of Breath    Michael Alexander. is a 57 y.o. male. With past medical history of sarcoidosis, reactive airway disease, GERD, arthritis, PE on Xarelto who presents to the emergency department with chest pain and shortness of breath.   States last night around 10pm he noticed numbness and tingling to his left arm. He states he eventually went to sleep but then around 2am woke up and was having numbness and tingling in his right arm. At that time he called his primary care practice and spoke with triage RN who felt he did not need to go to ED at that time. He took 324 ASA and went to bed. He states this morning he woke up and was having intermittent, sharp right sided chest pains radiating into his right shoulder. He was also feeling more short of breath at the time. He states about 2 weeks ago he missed 3 days of his Xarelto due to a lapsing prescription. He states that he again missed a dose on Tuesday. Additionally, he has sarcoidosis but feels this is increased from his baseline. He denies palpitations, lightheadedness or dizziness, syncope, back pain, nausea or vomiting or diaphoresis, cough or fever.     Chest Pain Associated symptoms: shortness of breath   Associated symptoms: no cough, no fever, no nausea and no palpitations   Shortness of Breath Associated symptoms: chest pain   Associated symptoms: no cough and no fever        Home Medications Prior to Admission medications   Medication Sig Start Date End Date Taking? Authorizing Provider  acetaminophen (TYLENOL) 325 MG tablet Take 325-650 mg by mouth every 6 (six) hours as needed for headache or mild pain.    [provider]  albuterol (PROVENTIL HFA;VENTOLIN HFA) 108 (90 Base) MCG/ACT inhaler Inhale 2 puffs into the  lungs every 6 (six) hours as needed for wheezing or shortness of breath.    [provider]  albuterol (PROVENTIL) (2.5 MG/3ML) 0.083% nebulizer solution Take 2.5 mg by nebulization every 6 (six) hours as needed for wheezing or shortness of breath.    [provider]  albuterol (VENTOLIN HFA) 108 (90 Base) MCG/ACT inhaler Inhale 2 puffs into the lungs every 4 (four) hours as needed for wheezing or shortness of breath. 12/20/21   Charlesetta Shanks, MD  Aspirin-Salicylamide-Caffeine (BC HEADACHE POWDER PO) Take 1 packet by mouth 2 (two) times daily as needed (for headaches or pain).    [provider]  doxycycline (VIBRAMYCIN) 100 MG capsule Take 1 capsule (100 mg total) by mouth 2 (two) times daily. One po bid x 7 days 12/20/21   Charlesetta Shanks, MD  fluticasone-salmeterol (ADVAIR) 500-50 MCG/ACT AEPB Inhale 1 puff into the lungs at bedtime. 01/20/20   [provider]  HYDROcodone-acetaminophen (NORCO) 5-325 MG tablet Take 1-2 tablets by mouth every 6 (six) hours as needed for moderate pain. Patient not taking: Reported on 12/20/2021 10/24/16   Hessie Knows, MD  NON FORMULARY Take 30-120 mLs by mouth See admin instructions. Ginger root/turmeric/lemon juice: Juice and drink 30-120 ml's by mouth daily    [provider]  PRILOSEC OTC 20 MG tablet Take 20 mg by mouth at bedtime.    [provider]  rivaroxaban (XARELTO) 10 MG TABS tablet Take  10 mg by mouth at bedtime.    [provider]      Allergies    Patient has no known allergies.    Review of Systems   Review of Systems  Constitutional:  Negative for fever.  Respiratory:  Positive for shortness of breath. Negative for cough.   Cardiovascular:  Positive for chest pain. Negative for palpitations and leg swelling.  Gastrointestinal:  Negative for nausea.  All other systems reviewed and are negative.   Physical Exam Updated Vital Signs BP (!) 155/99   Pulse (!) 58   Temp 97.8 F  (36.6 C) (Oral)   Resp 20   Ht 5' 8"$  (1.727 m)   Wt 84.4 kg   SpO2 100%   BMI 28.28 kg/m  Physical Exam Vitals and nursing note reviewed.  Constitutional:      General: He is not in acute distress.    Appearance: Normal appearance. He is well-developed and normal weight. He is not ill-appearing or toxic-appearing.  HENT:     Head: Normocephalic.  Eyes:     General: No scleral icterus.    Pupils: Pupils are equal, round, and reactive to light.  Neck:     Vascular: No JVD.  Cardiovascular:     Rate and Rhythm: Normal rate and regular rhythm.     Pulses:          Radial pulses are 2+ on the right side and 2+ on the left side.     Heart sounds: Normal heart sounds. No murmur heard. Pulmonary:     Effort: Pulmonary effort is normal. No tachypnea or respiratory distress.     Breath sounds: Examination of the right-lower field reveals rales. Examination of the left-lower field reveals rales. Rales present.  Chest:     Chest wall: No tenderness.  Abdominal:     General: Bowel sounds are normal.     Palpations: Abdomen is soft.  Musculoskeletal:     Cervical back: Normal range of motion.     Right lower leg: No edema.     Left lower leg: No edema.  Skin:    General: Skin is warm and dry.     Capillary Refill: Capillary refill takes less than 2 seconds.  Neurological:     General: No focal deficit present.     Mental Status: He is alert and oriented to person, place, and time.  Psychiatric:        Mood and Affect: Mood normal.        Behavior: Behavior normal.     ED Results / Procedures / Treatments   Labs (all labs ordered are listed, but only abnormal results are displayed) Labs Reviewed  CBC  COMPREHENSIVE METABOLIC PANEL  HEPARIN LEVEL (UNFRACTIONATED)  APTT  TROPONIN I (HIGH SENSITIVITY)  TROPONIN I (HIGH SENSITIVITY)    EKG EKG Interpretation  Date/Time:  Sunday November 10 2022 07:37:08 EST Ventricular Rate:  69 PR Interval:  142 QRS Duration: 78 QT  Interval:  372 QTC Calculation: 398 R Axis:   -62 Text Interpretation: Normal sinus rhythm Left axis deviation Inferior infarct , age undetermined Anterior infarct , age undetermined Abnormal ECG When compared with ECG of 20-Dec-2021 13:02, PREVIOUS ECG IS PRESENT Confirmed by Dene Gentry [phone]) on 11/10/2022 8:36:28 AM  Radiology CT Angio Chest PE W/Cm &/Or Wo Cm  Result Date: 11/10/2022 CLINICAL DATA:  Chest pain and SOB. EXAM: CT ANGIOGRAPHY CHEST WITH CONTRAST TECHNIQUE: Multidetector CT imaging of the chest was performed using the  standard protocol during bolus administration of intravenous contrast. Multiplanar CT image reconstructions and MIPs were obtained to evaluate the vascular anatomy. RADIATION DOSE REDUCTION: This exam was performed according to the departmental dose-optimization program which includes automated exposure control, adjustment of the mA and/or kV according to patient size and/or use of iterative reconstruction technique. CONTRAST:  75 mL OMNIPAQUE IOHEXOL 350 MG/ML SOLN COMPARISON:  12/20/2021 FINDINGS: Cardiovascular: Filling defects identified in bilateral pulmonary artery branches consistent with fairly extensive bilateral pulmonary emboli. No evidence of aortic aneurysm. No cardiomegaly or pericardial effusion. Contrast reflux to the IVC consistent with tricuspid insufficiency. Mediastinum/Nodes: No enlarged mediastinal, hilar, or axillary lymph nodes. Thyroid gland, trachea, and esophagus demonstrate no significant findings. Lungs/Pleura: There is pulmonary interstitial fibrosis with bibasilar and right apical honeycombing. This is stable from the prior study. Otherwise no evidence of pneumonia or pulmonary edema. No pleural effusion or pneumothorax. Upper Abdomen: No acute abnormality. Musculoskeletal: Minimal bilateral gynecomastia noted. Review of the MIP images confirms the above findings. IMPRESSION: 1. Multiple bilateral pulmonary emboli. 2. No evidence of aneurysm.  3. Mild tricuspid insufficiency. 4. Stable fibrosis. Findings were discussed with and acknowledged by  Dr. Dene Gentry Electronically Signed   By: Sammie Bench M.D.   On: 11/10/2022 10:32   DG Chest 2 View  Result Date: 11/10/2022 CLINICAL DATA:  Acute chest pain and shortness of breath EXAM: CHEST - 2 VIEW COMPARISON:  12/20/2021 and prior studies FINDINGS: The cardiomediastinal silhouette is unchanged. Bibasilar interstitial/fibrotic changes are again noted. There is no evidence of new pulmonary opacity, pneumothorax or pleural effusion. No acute bony abnormalities are present. IMPRESSION: 1. No acute cardiopulmonary disease. 2. Bibasilar interstitial/fibrosis changes. Electronically Signed   By: Margarette Canada M.D.   On: 11/10/2022 08:18    Procedures .Critical Care  Performed by: Mickie Hillier, PA-C Authorized by: Mickie Hillier, PA-C   Critical care provider statement:    Critical care time (minutes):  40   Critical care time was exclusive of:  Separately billable procedures and treating other patients   Critical care was necessary to treat or prevent imminent or life-threatening deterioration of the following conditions:  Respiratory failure   Critical care was time spent personally by me on the following activities:  Development of treatment plan with patient or surrogate, discussions with consultants, discussions with primary provider, evaluation of patient's response to treatment, examination of patient, interpretation of cardiac output measurements, obtaining history from patient or surrogate, review of old charts, re-evaluation of patient's condition, pulse oximetry, ordering and review of laboratory studies, ordering and review of radiographic studies and ordering and performing treatments and interventions   I assumed direction of critical care for this patient from another provider in my specialty: no     Care discussed with: admitting provider      Medications Ordered in  ED Medications  heparin ADULT infusion 100 units/mL (25000 units/254m) (1,500 Units/hr Intravenous New Bag/Given 11/10/22 1119)  iohexol (OMNIPAQUE) 350 MG/ML injection 75 mL (75 mLs Intravenous Contrast Given 11/10/22 0959)  heparin bolus via infusion 5,000 Units (5,000 Units Intravenous Bolus from Bag 11/10/22 1120)    ED Course/ Medical Decision Making/ A&P           HEART Score: 2                Medical Decision Making Amount and/or Complexity of Data Reviewed Labs: ordered. Radiology: ordered.  Risk Prescription drug management. Decision regarding hospitalization.  Initial Impression and Ddx 57year old male who  presents to the emergency department with chest pain and shortness of breath.  Patient PMH that increases complexity of ED encounter:  PE, sarcoidosis, GERD Differential: Acute chest syndrome, stable angina, atypical angina, pulmonary embolism, pneumothorax, aortic dissection, pleural effusion, CHF, COPD, asthma, myocarditis, pericarditis, cardiac tamponade, chest wall pain   Interpretation of Diagnostics I independent reviewed and interpreted the labs as followed: CMP, CBC, troponin unremarkable  - I independently visualized the following imaging with scope of interpretation limited to determining acute life threatening conditions related to emergency care: CXR, which revealed stable fibrotic changes. CTA PE Study positive for bilateral PE with mild right heart strain  Patient Reassessment and Ultimate Disposition/Management On initial assessment, primary concern for recurrent PE in the setting of missed Xarelto doses. Will obtain CTA PE study. Will also rule out ACS.  62: CT PE study with bilateral pulmonary emboli with mild right heart strain.  Will start heparin drip and admit patient.  Troponin x 2 negative, EKG without ischemic changes, doubt ACS at this time. Chest x-ray does not demonstrate pneumonia, pneumothorax or pleural effusion.  His symptoms are  inconsistent with a dissection, myocarditis or pericarditis. He was started on heparin bolus and then infusion. 1136: Spoke with Dr. Linda Hedges, hospitalist who agrees to admit the patient.  Patient is agreeable to plan.  Patient management required discussion with the following services or consulting groups:  Hospitalist Service  Complexity of Problems Addressed Acute illness or injury that poses threat of life of bodily function  Additional Data Reviewed and Analyzed Further history obtained from: Further history from spouse/family member, Past medical history and medications listed in the EMR, Prior ED visit notes, Recent PCP notes, and Care Everywhere  Patient Encounter Risk Assessment Prescriptions, Use of parenteral controlled substances, and Consideration of hospitalization  Final Clinical Impression(s) / ED Diagnoses Final diagnoses:  Bilateral pulmonary embolism [facility])    Rx / DC Orders ED Discharge Orders     None         Mickie Hillier, PA-C 11/10/22 1138    Mickie Hillier, PA-C 11/10/22 1146    Mickie Hillier, PA-C 11/10/22 1305    Valarie Merino, MD 11/10/22 [phone]

## 2022-11-10 NOTE — Assessment & Plan Note (Signed)
Patient had previous episode and was started on Xarelto. He never was seen by hematology in regard to clotting disorder. 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 No worrisome or unexplained weight loss. No other signs or findings to suggestive malignancy. 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 Nl LE exam - no indication for LE venous doppler.  Plan Heparin qtt 24 hrs  Resume xarelto  Limited activity 24 hrs  Coagulation panel  Outpatient follow up with hematology - he prefers to schedule at [facility]

## 2022-11-10 NOTE — ED Triage Notes (Signed)
Pt here from home with c/o left and right shoulder pain with some chest pain and sob , pt has hx of PE , missed about 2 weeks of his blood thinner but is back on it now

## 2022-11-10 NOTE — ED Notes (Signed)
ED TO INPATIENT HANDOFF REPORT  ED Nurse Name and Phone #: Caryl Pina [phone]  S Name/Age/Gender Michael Alexander. 57 y.o. male Room/Bed: 044C/044C  Code Status   Code Status: Full Code  Home/SNF/Other Home Patient oriented to: self, place, time, and situation Is this baseline? Yes   Triage Complete: Triage complete  Chief Complaint Recurrent pulmonary embolism ([place]) [I26.99]  Triage Note Pt here from home with c/o left and right shoulder pain with some chest pain and sob , pt has hx of PE , missed about 2 weeks of his blood thinner but is back on it now    Allergies No Known Allergies  Level of Care/Admitting Diagnosis ED Disposition     ED Disposition  Admit   Condition  --   Wanda: [place] [[id number]]  Level of Care: Telemetry Medical [[id number]]  May place patient in observation at [facility] or [place] if equivalent level of care is available:: Yes  Covid Evaluation: Asymptomatic - no recent exposure (last 10 days) testing not required  Diagnosis: [id number]  Admitting Physician: Neena Rhymes [[id number]]  Attending Physician: Neena Rhymes [[id number]]          B Medical/Surgery History Past Medical History:  Diagnosis Date   Arthritis    Connective tissue disease ([place])    GERD (gastroesophageal reflux disease)    Hypertriglyceridemia    Polymyositis ([place])    Pulmonary embolus ([place]) 11/10/2022   bilateral   Reactive airway disease    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   Past Surgical History:  Procedure Laterality Date   CARPAL TUNNEL RELEASE Right 10/24/2016   Procedure: CARPAL TUNNEL RELEASE;  Surgeon: Hessie Knows, MD;  Location: [facility] ORS;  Service: Orthopedics;  Laterality: Right;   VASECTOMY       A IV Location/Drains/Wounds Patient Lines/Drains/Airways Status     Active Line/Drains/Airways     Name Placement date Placement time Site Days   Peripheral  IV 11/10/22 20 G Right Antecubital 11/10/22  0850  Antecubital  less than 1   Incision (Closed) 10/24/16 Wrist Right 10/24/16  0743  -- 2208            Intake/Output Last 24 hours No intake or output data in the 24 hours ending 11/10/22 1512  Labs/Imaging Results for orders placed or performed during the hospital encounter of 11/10/22 (from the past 48 hour(s))  CBC     Status: None   Collection Time: 11/10/22  7:53 AM  Result Value Ref Range   WBC 7.9 4.0 - 10.5 K/uL   RBC 4.88 4.22 - 5.81 MIL/uL   Hemoglobin 15.4 13.0 - 17.0 g/dL   HCT 46.3 39.0 - 52.0 %   MCV 94.9 80.0 - 100.0 fL   MCH 31.6 26.0 - 34.0 pg   MCHC 33.3 30.0 - 36.0 g/dL   RDW 13.0 11.5 - 15.5 %   Platelets 302 150 - 400 K/uL   nRBC 0.0 0.0 - 0.2 %    Comment: Performed at [facility], [place] [address], [place], [place] [address]  Troponin I (High Sensitivity)     Status: None   Collection Time: 11/10/22  7:53 AM  Result Value Ref Range   Troponin I (High Sensitivity) 4 <18 ng/L    Comment: (NOTE) Elevated high sensitivity troponin I (hsTnI) values and significant  changes across serial measurements may suggest ACS but many other  chronic  and acute conditions are known to elevate hsTnI results.  Refer to the "Links" section for chest pain algorithms and additional  guidance. Performed at [facility], [place] [address], [place], [place] [address]   Comprehensive metabolic panel     Status: None   Collection Time: 11/10/22  7:53 AM  Result Value Ref Range   Sodium 137 135 - 145 mmol/L   Potassium 3.6 3.5 - 5.1 mmol/L   Chloride 102 98 - 111 mmol/L   CO2 25 22 - 32 mmol/L   Glucose, Bld 90 70 - 99 mg/dL    Comment: Glucose reference range applies only to samples taken after fasting for at least 8 hours.   BUN 7 6 - 20 mg/dL   Creatinine, Ser 0.97 0.61 - 1.24 mg/dL   Calcium 9.4 8.9 - 10.3 mg/dL   Total Protein 7.5 6.5 - 8.1 g/dL   Albumin 3.9 3.5 - 5.0 g/dL   AST 21 15 - 41 U/L   ALT 15 0  - 44 U/L   Alkaline Phosphatase 78 38 - 126 U/L   Total Bilirubin 0.5 0.3 - 1.2 mg/dL   GFR, Estimated >60 >60 mL/min    Comment: (NOTE) Calculated using the CKD-EPI Creatinine Equation (2021)    Anion gap 10 5 - 15    Comment: Performed at [place] [address], [place], [place] [address]  Troponin I (High Sensitivity)     Status: None   Collection Time: 11/10/22  9:50 AM  Result Value Ref Range   Troponin I (High Sensitivity) 3 <18 ng/L    Comment: (NOTE) Elevated high sensitivity troponin I (hsTnI) values and significant  changes across serial measurements may suggest ACS but many other  chronic and acute conditions are known to elevate hsTnI results.  Refer to the "Links" section for chest pain algorithms and additional  guidance. Performed at [facility], [place] [address] [place], [place] [address]    CT Angio Chest PE W/Cm &/Or Wo Cm  Result Date: 11/10/2022 CLINICAL DATA:  Chest pain and SOB. EXAM: CT ANGIOGRAPHY CHEST WITH CONTRAST TECHNIQUE: Multidetector CT imaging of the chest was performed using the standard protocol during bolus administration of intravenous contrast. Multiplanar CT image reconstructions and MIPs were obtained to evaluate the vascular anatomy. RADIATION DOSE REDUCTION: This exam was performed according to the departmental dose-optimization program which includes automated exposure control, adjustment of the mA and/or kV according to patient size and/or use of iterative reconstruction technique. CONTRAST:  75 mL OMNIPAQUE IOHEXOL 350 MG/ML SOLN COMPARISON:  12/20/2021 FINDINGS: Cardiovascular: Filling defects identified in bilateral pulmonary artery branches consistent with fairly extensive bilateral pulmonary emboli. No evidence of aortic aneurysm. No cardiomegaly or pericardial effusion. Contrast reflux to the IVC consistent with tricuspid insufficiency. Mediastinum/Nodes: No enlarged mediastinal, hilar, or axillary lymph nodes. Thyroid  gland, trachea, and esophagus demonstrate no significant findings. Lungs/Pleura: There is pulmonary interstitial fibrosis with bibasilar and right apical honeycombing. This is stable from the prior study. Otherwise no evidence of pneumonia or pulmonary edema. No pleural effusion or pneumothorax. Upper Abdomen: No acute abnormality. Musculoskeletal: Minimal bilateral gynecomastia noted. Review of the MIP images confirms the above findings. IMPRESSION: 1. Multiple bilateral pulmonary emboli. 2. No evidence of aneurysm. 3. Mild tricuspid insufficiency. 4. Stable fibrosis. Findings were discussed with and acknowledged by  Dr. Dene Gentry Electronically Signed   By: Sammie Bench M.D.   On: 11/10/2022 10:32   DG Chest 2 View  Result Date: 11/10/2022 CLINICAL DATA:  Acute chest pain and shortness of breath EXAM: CHEST - 2 VIEW COMPARISON:  12/20/2021 and prior studies FINDINGS: The cardiomediastinal silhouette is unchanged. Bibasilar interstitial/fibrotic changes are again noted. There is no evidence of new pulmonary opacity, pneumothorax or pleural effusion. No acute bony abnormalities are present. IMPRESSION: 1. No acute cardiopulmonary disease. 2. Bibasilar interstitial/fibrosis changes. Electronically Signed   By: Margarette Canada M.D.   On: 11/10/2022 08:18    Pending Labs Unresulted Labs (From admission, onward)     Start     Ordered   11/11/22 0500  Heparin level (unfractionated)  Daily,   R     See Hyperspace for full Linked Orders Report.   11/10/22 1107   11/11/22 0500  CBC  Daily,   R     See Hyperspace for full Linked Orders Report.   11/10/22 1107   11/11/22 0500  APTT  Daily,   R      11/10/22 1107   11/11/22 0500  CBC  Tomorrow morning,   R        11/10/22 1421   11/11/22 [id number]  Basic metabolic panel  Tomorrow morning,   R        11/10/22 1421   11/10/22 1700  Heparin level (unfractionated)  Once-Timed,   URGENT       See Hyperspace for full Linked Orders Report.   11/10/22 1107    11/10/22 1700  APTT  Once-Timed,   STAT       See Hyperspace for full Linked Orders Report.   11/10/22 1107   11/10/22 1432  Sedimentation rate  Once,   R        11/10/22 1432   11/10/22 1432  Factor 5 leiden  Once,   R        11/10/22 1432   11/10/22 1432  Protein S activity  (PROTEIN S, TOTAL AND FUNCTIONAL PANEL (PNL))  Once,   R        11/10/22 1432   11/10/22 1432  Protein S, total  (PROTEIN S, TOTAL AND FUNCTIONAL PANEL (PNL))  Once,   R        11/10/22 1432   11/10/22 1432  Protein C activity  (Protein C, Total and Functional Panel (PNL))  Once,   R        11/10/22 1432   11/10/22 1432  Protein C, total  (Protein C, Total and Functional Panel (PNL))  Once,   R        11/10/22 1432   11/10/22 1432  Antithrombin III  Once,   R        11/10/22 1432   11/10/22 1432  Von Willebrand panel  Once,   R        11/10/22 1432   11/10/22 1432  Beta-2-glycoprotein i abs, IgG/M/A  Once,   R        11/10/22 1432            Vitals/Pain Today's Vitals   11/10/22 1121 11/10/22 1200 11/10/22 1300 11/10/22 1400  BP:  (!) 145/111 (!) 136/90 (!) 155/91  Pulse:  71 87 67  Resp:  18 20 17  $ Temp: 98 F (36.7 C)     TempSrc: Oral     SpO2:  99% 100% 99%  Weight:      Height:      PainSc:        Isolation Precautions No active isolations  Medications Medications  heparin ADULT  infusion 100 units/mL (25000 units/266m) (1,500 Units/hr Intravenous New Bag/Given 11/10/22 1119)  acetaminophen (TYLENOL) tablet 325-650 mg (has no administration in time range)  omeprazole (PRILOSEC) capsule 20 mg (has no administration in time range)  albuterol (PROVENTIL) (2.5 MG/3ML) 0.083% nebulizer solution 2.5 mg (has no administration in time range)  mometasone-formoterol (DULERA) 200-5 MCG/ACT inhaler 2 puff (has no administration in time range)  sodium chloride flush (NS) 0.9 % injection 3 mL (has no administration in time range)  sodium chloride flush (NS) 0.9 % injection 3 mL (has no administration  in time range)  0.9 %  sodium chloride infusion (has no administration in time range)  traZODone (DESYREL) tablet 25 mg (has no administration in time range)  hydrALAZINE (APRESOLINE) injection 2 mg (has no administration in time range)  iohexol (OMNIPAQUE) 350 MG/ML injection 75 mL (75 mLs Intravenous Contrast Given 11/10/22 0959)  heparin bolus via infusion 5,000 Units (5,000 Units Intravenous Bolus from Bag 11/10/22 1120)    Mobility walks     Focused Assessments Cardiac Assessment Handoff:    No results found for: "CKTOTAL", "CKMB", "CKMBINDEX", "TROPONINI" No results found for: "DDIMER"    R Recommendations: See Admitting Provider Note  Report given to:   Additional Notes: n/a

## 2022-11-10 NOTE — Progress Notes (Signed)
Patient brought to 4E from ED. VSS. Telemetry box applied, CCMD notified. Patient oriented to room and staff. Call bell in reach.  Daymon Larsen, RN

## 2022-11-10 NOTE — ED Notes (Signed)
Patient transported to CT 

## 2022-11-10 NOTE — Progress Notes (Signed)
ANTICOAGULATION CONSULT NOTE - Initial Consult  Pharmacy Consult for heparin Indication: pulmonary embolus  No Known Allergies  Patient Measurements: Height: 5' 8"$  (172.7 cm) Weight: 86 kg (189 lb 9.5 oz) IBW/kg (Calculated) : 68.4 Heparin Dosing Weight: TBW  Vital Signs: Temp: 98.2 F (36.8 C) (02/11 1555) Temp Source: Oral (02/11 1555) BP: 140/103 (02/11 1555) Pulse Rate: 73 (02/11 1555)  Labs: Recent Labs    11/10/22 0753 11/10/22 0950 11/10/22 1727  HGB 15.4  --   --   HCT 46.3  --   --   PLT 302  --   --   APTT  --   --  174*  HEPARINUNFRC  --   --  >1.10*  CREATININE 0.97  --   --   TROPONINIHS 4 3  --      Estimated Creatinine Clearance: 90.7 mL/min (by C-G formula based on SCr of 0.97 mg/dL).   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  Heparin level >1.1 as expected due to Xarelto interference. aPTT supratherapeutic at 174  Goal of Therapy:  Heparin level 0.3-0.7 units/ml aPTT 66-102 seconds Monitor platelets by anticoagulation protocol: Yes   Plan:  Decrease Heparin drip to 1350 units/hr F/u 6 hour aPTT  F/u daily aPTT and Anti-Xa level and CBC  CAlanda Slim PharmD, FEndoscopy Center Of Topeka LPClinical Pharmacist Please see AMION for all Pharmacists' Contact Phone Numbers 11/10/2022, 6:55 PM

## 2022-11-10 NOTE — H&P (Signed)
History and Physical    Michael Alexander. [id number] DOB: May 17, 1966 DOA: 11/10/2022  DOS: the patient was seen and examined on 11/10/2022  PCP: Valera Castle, MD   Patient coming from: Home  I have personally briefly reviewed patient's old medical records in [place]. is a 57 y.o. male. With past medical history of sarcoidosis, reactive airway disease, GERD, arthritis, polymyositis, auto-immune syndrome,interstial lung disease and multiple PE for which he has been on Xarelto who presents to the emergency department with chest pain and shortness of breath. Reports having missed several days of Xarelto several weeks ago but restarted. He also reports have recently missed one or two doses of Xarelto.   Last night around 10pm he noticed numbness and tingling to his left arm. He states he eventually went to sleep but then around 2am woke up and was having numbness and tingling in his right arm. He took 324 ASA and went to bed. He states this morning he woke up and was having intermittent, sharp right sided chest pains radiating into his right shoulder. He was also feeling more short of breath at the time. Additionally, he has sarcoidosis but feels this is increased from his baseline. He denies palpitations, lightheadedness or dizziness, syncope, back pain, nausea or vomiting or diaphoresis, cough or fever.     ED Course: afebrile, 141/111  HR 71  18  Cmet nl, CBCD nl, CTA - bilateral interstial fibrosis, bilateral pulmonary arterial filling defects c/w  multiple bilateral pulmonary emboli. Patient was started on a heparin qtt and [facility] is called to admit for further evaluation and management  Review of Systems:  Review of Systems  Constitutional:  Positive for weight loss. Negative for chills, fever and malaise/fatigue.       Reports approximately 20 lb weight loss that was intentional - juice fasting and another 15 lbs due to decreased calorie intake related to  pain in his jaw and teeth with eating due to corrective dental appliance.   HENT: Negative.    Eyes: Negative.   Respiratory:  Positive for shortness of breath.        Baseline mild wheezing  Cardiovascular: Negative.  Negative for chest pain, palpitations, orthopnea and PND.  Gastrointestinal:  Negative for abdominal pain and heartburn.  Genitourinary: Negative.   Musculoskeletal: Negative.   Skin: Negative.   Neurological: Negative.   Endo/Heme/Allergies: Negative.   Psychiatric/Behavioral: Negative.      Past Medical History:  Diagnosis Date   Arthritis    Connective tissue disease ([place])    GERD (gastroesophageal reflux disease)    Hypertriglyceridemia    Polymyositis (HCC)    Pulmonary embolus ([place]) 11/10/2022   bilateral   Reactive airway disease    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    Past Surgical History:  Procedure Laterality Date   CARPAL TUNNEL RELEASE Right 10/24/2016   Procedure: CARPAL TUNNEL RELEASE;  Surgeon: Hessie Knows, MD;  Location: [facility] ORS;  Service: Orthopedics;  Laterality: Right;   VASECTOMY      Soc Hx -  married 22 years. He has two sons, 40, 13 with the youngest going to [place] in August. Work - a Tax adviser for [employer]). Lives with his wife   reports that he has never smoked. He has never used smokeless tobacco. He reports current alcohol use. He reports that he does not use drugs.  No Known Allergies  History reviewed. No pertinent family history.  Prior to  Admission medications   Medication Sig Start Date End Date Taking? Authorizing Provider  acetaminophen (TYLENOL) 325 MG tablet Take 325-650 mg by mouth every 6 (six) hours as needed for headache or mild pain.   Yes [provider]  albuterol (PROVENTIL) (2.5 MG/3ML) 0.083% nebulizer solution Take 2.5 mg by nebulization every 6 (six) hours as needed for wheezing or shortness of breath.   Yes [provider]  albuterol (VENTOLIN HFA) 108 (90 Base)  MCG/ACT inhaler Inhale 2 puffs into the lungs every 4 (four) hours as needed for wheezing or shortness of breath. 12/20/21  Yes Pfeiffer, Jeannie Done, MD  Aspirin-Salicylamide-Caffeine (BC HEADACHE POWDER PO) Take 1 packet by mouth 2 (two) times daily as needed (for headaches or pain).   Yes [provider]  fluticasone-salmeterol (ADVAIR) 500-50 MCG/ACT AEPB Inhale 1 puff into the lungs at bedtime. 01/20/20  Yes [provider]  rivaroxaban (XARELTO) 10 MG TABS tablet Take 10 mg by mouth at bedtime.   Yes [provider]  doxycycline (VIBRAMYCIN) 100 MG capsule Take 1 capsule (100 mg total) by mouth 2 (two) times daily. One po bid x 7 days Patient not taking: Reported on 11/10/2022 12/20/21   Charlesetta Shanks, MD  HYDROcodone-acetaminophen (NORCO) 5-325 MG tablet Take 1-2 tablets by mouth every 6 (six) hours as needed for moderate pain. Patient not taking: Reported on 12/20/2021 10/24/16   Hessie Knows, MD  PRILOSEC OTC 20 MG tablet Take 20 mg by mouth at bedtime.    [provider]    Physical Exam: Vitals:   11/10/22 1121 11/10/22 1200 11/10/22 1300 11/10/22 1400  BP:  (!) 145/111 (!) 136/90 (!) 155/91  Pulse:  71 87 67  Resp:  18 20 17  $ Temp: 98 F (36.7 C)     TempSrc: Oral     SpO2:  99% 100% 99%  Weight:      Height:        Physical Exam Vitals and nursing note reviewed.  Constitutional:      General: He is in acute distress.     Appearance: He is well-developed and normal weight. He is not ill-appearing.  HENT:     Head: Normocephalic and atraumatic.     Comments: Dental appliance in place. NO jaw deformity. MM moist Eyes:     Extraocular Movements: Extraocular movements intact.     Pupils: Pupils are equal, round, and reactive to light.  Neck:     Thyroid: No thyromegaly.  Cardiovascular:     Rate and Rhythm: Normal rate and regular rhythm.     Pulses: Normal pulses.     Heart sounds: Normal heart sounds. No murmur heard. Pulmonary:      Effort: Pulmonary effort is normal.     Breath sounds: Rales present. No wheezing or rhonchi.     Comments: Chronic mild bibasilar rales 2/2 interstial lung disease Abdominal:     General: Bowel sounds are normal.     Palpations: Abdomen is soft.  Musculoskeletal:        General: Normal range of motion.     Cervical back: Normal range of motion and neck supple.  Skin:    General: Skin is warm and dry.  Neurological:     General: No focal deficit present.     Mental Status: He is alert and oriented to person, place, and time.     Cranial Nerves: No cranial nerve deficit.  Psychiatric:        Mood and Affect: Mood normal.  Behavior: Behavior normal.      Labs on Admission: I have personally reviewed following labs and imaging studies  CBC: Recent Labs  Lab 11/10/22 0753  WBC 7.9  HGB 15.4  HCT 46.3  MCV 94.9  PLT 99991111   Basic Metabolic Panel: Recent Labs  Lab 11/10/22 0753  NA 137  K 3.6  CL 102  CO2 25  GLUCOSE 90  BUN 7  CREATININE 0.97  CALCIUM 9.4   GFR: Estimated Creatinine Clearance: 90 mL/min (by C-G formula based on SCr of 0.97 mg/dL). Liver Function Tests: Recent Labs  Lab 11/10/22 0753  AST 21  ALT 15  ALKPHOS 78  BILITOT 0.5  PROT 7.5  ALBUMIN 3.9   No results for input(s): "LIPASE", "AMYLASE" in the last 168 hours. No results for input(s): "AMMONIA" in the last 168 hours. Coagulation Profile: No results for input(s): "INR", "PROTIME" in the last 168 hours. Cardiac Enzymes: No results for input(s): "CKTOTAL", "CKMB", "CKMBINDEX", "TROPONINI" in the last 168 hours. BNP (last 3 results) No results for input(s): "PROBNP" in the last 8760 hours. HbA1C: No results for input(s): "HGBA1C" in the last 72 hours. CBG: No results for input(s): "GLUCAP" in the last 168 hours. Lipid Profile: No results for input(s): "CHOL", "HDL", "LDLCALC", "TRIG", "CHOLHDL", "LDLDIRECT" in the last 72 hours. Thyroid Function Tests: No results for input(s):  "TSH", "T4TOTAL", "FREET4", "T3FREE", "THYROIDAB" in the last 72 hours. Anemia Panel: No results for input(s): "VITAMINB12", "FOLATE", "FERRITIN", "TIBC", "IRON", "RETICCTPCT" in the last 72 hours. Urine analysis: No results found for: "COLORURINE", "APPEARANCEUR", "LABSPEC", "PHURINE", "GLUCOSEU", "HGBUR", "BILIRUBINUR", "KETONESUR", "PROTEINUR", "UROBILINOGEN", "NITRITE", "LEUKOCYTESUR"  Radiological Exams on Admission: I have personally reviewed images CT Angio Chest PE W/Cm &/Or Wo Cm  Result Date: 11/10/2022 CLINICAL DATA:  Chest pain and SOB. EXAM: CT ANGIOGRAPHY CHEST WITH CONTRAST TECHNIQUE: Multidetector CT imaging of the chest was performed using the standard protocol during bolus administration of intravenous contrast. Multiplanar CT image reconstructions and MIPs were obtained to evaluate the vascular anatomy. RADIATION DOSE REDUCTION: This exam was performed according to the departmental dose-optimization program which includes automated exposure control, adjustment of the mA and/or kV according to patient size and/or use of iterative reconstruction technique. CONTRAST:  75 mL OMNIPAQUE IOHEXOL 350 MG/ML SOLN COMPARISON:  12/20/2021 FINDINGS: Cardiovascular: Filling defects identified in bilateral pulmonary artery branches consistent with fairly extensive bilateral pulmonary emboli. No evidence of aortic aneurysm. No cardiomegaly or pericardial effusion. Contrast reflux to the IVC consistent with tricuspid insufficiency. Mediastinum/Nodes: No enlarged mediastinal, hilar, or axillary lymph nodes. Thyroid gland, trachea, and esophagus demonstrate no significant findings. Lungs/Pleura: There is pulmonary interstitial fibrosis with bibasilar and right apical honeycombing. This is stable from the prior study. Otherwise no evidence of pneumonia or pulmonary edema. No pleural effusion or pneumothorax. Upper Abdomen: No acute abnormality. Musculoskeletal: Minimal bilateral gynecomastia noted. Review of  the MIP images confirms the above findings. IMPRESSION: 1. Multiple bilateral pulmonary emboli. 2. No evidence of aneurysm. 3. Mild tricuspid insufficiency. 4. Stable fibrosis. Findings were discussed with and acknowledged by  Dr. Dene Gentry Electronically Signed   By: Sammie Bench M.D.   On: 11/10/2022 10:32   DG Chest 2 View  Result Date: 11/10/2022 CLINICAL DATA:  Acute chest pain and shortness of breath EXAM: CHEST - 2 VIEW COMPARISON:  12/20/2021 and prior studies FINDINGS: The cardiomediastinal silhouette is unchanged. Bibasilar interstitial/fibrotic changes are again noted. There is no evidence of new pulmonary opacity, pneumothorax or pleural effusion. No acute bony abnormalities  are present. IMPRESSION: 1. No acute cardiopulmonary disease. 2. Bibasilar interstitial/fibrosis changes. Electronically Signed   By: Margarette Canada M.D.   On: 11/10/2022 08:18    EKG: I have personally reviewed EKG: NSR, old injury inferior and anterior.  Assessment/Plan Active Problems:   Recurrent pulmonary embolism (HCC)   Interstitial lung disease (HCC)   GERD (gastroesophageal reflux disease)    Assessment and Plan: Recurrent pulmonary embolism ([place]) Patient had previous episode and was started on Xarelto. He never was seen by hematology in regard to clotting disorder. 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 No worrisome or unexplained weight loss. No other signs or findings to suggestive malignancy. 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 Nl LE exam - no indication for LE venous doppler.  Plan Heparin qtt 24 hrs  Resume xarelto  Limited activity 24 hrs  Coagulation panel  Outpatient follow up with hematology - he prefers to schedule at [facility]  Interstitial lung disease [facility]) Patient with sarcoid related interstial lung disease - he is at his baseline.  Plan Continue home regimen  GERD (gastroesophageal reflux disease) No active  complaints. Decreased intake due to dental issues not GI issues  Plan Continue PPI tx       DVT prophylaxis: IV heparin gtts Code Status: Full Code Family Communication: wife joined in the interview by phone  Disposition Plan: home 24-48 hrs with outpatient followup hematology  Consults called: none  Admission status: Observation, Telemetry bed   Adella Hare, MD [facility] 11/10/2022, 2:19 PM

## 2022-11-11 ENCOUNTER — Other Ambulatory Visit (HOSPITAL_COMMUNITY): Payer: Self-pay

## 2022-11-11 DIAGNOSIS — I2699 Other pulmonary embolism without acute cor pulmonale: Secondary | ICD-10-CM | POA: Diagnosis not present

## 2022-11-11 LAB — BASIC METABOLIC PANEL
Anion gap: 11 (ref 5–15)
BUN: 6 mg/dL (ref 6–20)
CO2: 23 mmol/L (ref 22–32)
Calcium: 9.1 mg/dL (ref 8.9–10.3)
Chloride: 102 mmol/L (ref 98–111)
Creatinine, Ser: 0.93 mg/dL (ref 0.61–1.24)
GFR, Estimated: >60 mL/min (ref >60)
Glucose, Bld: 112 mg/dL — ABNORMAL HIGH (ref 70–99)
Potassium: 3.3 mmol/L — ABNORMAL LOW (ref 3.5–5.1)
Sodium: 136 mmol/L (ref 135–145)

## 2022-11-11 LAB — APTT
aPTT: 128 seconds — ABNORMAL HIGH (ref 24–36)
aPTT: 151 seconds — ABNORMAL HIGH (ref 24–36)

## 2022-11-11 LAB — HEPARIN LEVEL (UNFRACTIONATED)
Heparin Unfractionated: <0.1 IU/mL — ABNORMAL LOW (ref 0.30–0.70)
Heparin Unfractionated: 0.99 IU/mL — ABNORMAL HIGH (ref 0.30–0.70)

## 2022-11-11 LAB — CBC
HCT: 44 % (ref 39.0–52.0)
Hemoglobin: 14.7 g/dL (ref 13.0–17.0)
MCH: 31.4 pg (ref 26.0–34.0)
MCHC: 33.4 g/dL (ref 30.0–36.0)
MCV: 94 fL (ref 80.0–100.0)
Platelets: 282 10*3/uL (ref 150–400)
RBC: 4.68 MIL/uL (ref 4.22–5.81)
RDW: 13 % (ref 11.5–15.5)
WBC: 7.6 10*3/uL (ref 4.0–10.5)
nRBC: 0 % (ref 0.0–0.2)

## 2022-11-11 MED ORDER — RIVAROXABAN 10 MG PO TABS: 10.0000 mg | ORAL_TABLET | Freq: Every day | ORAL | Status: DC

## 2022-11-11 MED ORDER — RIVAROXABAN 20 MG PO TABS
20.0000 mg | ORAL_TABLET | Freq: Every day | ORAL | 0 refills | Status: DC
  Filled 2022-11-11: qty 30, 30d supply, fill #0

## 2022-11-11 MED ORDER — RIVAROXABAN (XARELTO) VTE STARTER PACK (15 & 20 MG)
ORAL_TABLET | ORAL | 0 refills | Status: DC
  Filled 2022-11-11: qty 51, 30d supply, fill #0

## 2022-11-11 MED ORDER — RIVAROXABAN 15 MG PO TABS
15.0000 mg | ORAL_TABLET | Freq: Two times a day (BID) | ORAL | Status: DC
  Administered 2022-11-11: 15 mg via ORAL
  Filled 2022-11-11: qty 1

## 2022-11-11 MED ORDER — RIVAROXABAN 20 MG PO TABS: 20.0000 mg | ORAL_TABLET | Freq: Every day | ORAL | Status: DC

## 2022-11-11 MED ORDER — RIVAROXABAN 15 MG PO TABS
15.0000 mg | ORAL_TABLET | Freq: Two times a day (BID) | ORAL | 0 refills | Status: DC
  Filled 2022-11-11: qty 42, 21d supply, fill #0

## 2022-11-11 NOTE — Progress Notes (Signed)
ANTICOAGULATION CONSULT NOTE  Pharmacy Consult for Heparin Indication: pulmonary embolus  No Known Allergies  Patient Measurements: Height: 5' 8"$  (172.7 cm) Weight: 86 kg (189 lb 9.5 oz) IBW/kg (Calculated) : 68.4  Heparin Dosing Weight: 86 kg  Vital Signs: Temp: 98.5 F (36.9 C) (02/12 0745) Temp Source: Oral (02/12 0745) BP: 136/94 (02/12 0745) Pulse Rate: 78 (02/12 0745)  Labs: Recent Labs    11/10/22 0753 11/10/22 0950 11/10/22 1727 11/11/22 0125 11/11/22 0611  HGB 15.4  --   --  14.7  --   HCT 46.3  --   --  44.0  --   PLT 302  --   --  282  --   APTT  --   --  174* 128* 151*  HEPARINUNFRC  --   --  >1.10* <0.10* 0.99*  CREATININE 0.97  --   --  0.93  --   TROPONINIHS 4 3  --   --   --     Estimated Creatinine Clearance: 94.6 mL/min (by C-G formula based on SCr of 0.93 mg/dL).   Assessment: 48 YOM with medical history significant for multiple Pes on Xarelto PTA in past but missed some doses recently then restarted (19m once daily ), last dose taken 2/10 PM. Patient presented with CP and SOB. 2/11 CT showed multiple bilateral pulmonary emboli. Pharmacy consulted to manage heparin.   2/12 AM HL/aPTT falsely low. Repeat levels with aPTT still above goal 151. CBC stable, platelets 282. No issues with heparin infusion or signs of bleeding reported. Per discussion with MD, will resume Xarelto with full dosing due to new PEs in the setting of uncertain duration of missed Xarelto doses PTA.  Goal of Therapy:  Heparin level 0.3-0.7 units/ml aPTT 66-102 seconds Monitor platelets by anticoagulation protocol: Yes   Plan:  Stop IV heparin Start rivaroxaban 127mPO twice daily x21 days followed by rivaroxaban 201mhereafter Continue to monitor H&H and platelets    Of note, on Xarelto PTA at maintenance dose   Thank you for allowing pharmacy to be a part of this patient's care.  AdrArdyth HarpsharmD Clinical Pharmacist

## 2022-11-11 NOTE — Discharge Instructions (Signed)
Information on my medicine - XARELTO (rivaroxaban)  This medication education was reviewed with me or my healthcare representative as part of my discharge preparation.    WHY WAS XARELTO PRESCRIBED FOR YOU? Xarelto was prescribed to treat blood clots that may have been found in the veins of your legs (deep vein thrombosis) or in your lungs (pulmonary embolism) and to reduce the risk of them occurring again.  What do you need to know about Xarelto? The starting dose is one 15 mg tablet taken TWICE daily with food for the FIRST 21 DAYS then on 12/02/2022  the dose is changed to one 20 mg tablet taken ONCE A DAY with your evening meal.  DO NOT stop taking Xarelto without talking to the health care provider who prescribed the medication.  Refill your prescription for 20 mg tablets before you run out.  After discharge, you should have regular check-up appointments with your healthcare provider that is prescribing your Xarelto.  In the future your dose may need to be changed if your kidney function changes by a significant amount.  What do you do if you miss a dose? If you are taking Xarelto TWICE DAILY and you miss a dose, take it as soon as you remember. You may take two 15 mg tablets (total 30 mg) at the same time then resume your regularly scheduled 15 mg twice daily the next day.  If you are taking Xarelto ONCE DAILY and you miss a dose, take it as soon as you remember on the same day then continue your regularly scheduled once daily regimen the next day. Do not take two doses of Xarelto at the same time.   Important Safety Information Xarelto is a blood thinner medicine that can cause bleeding. You should call your healthcare provider right away if you experience any of the following: Bleeding from an injury or your nose that does not stop. Unusual colored urine (red or dark brown) or unusual colored stools (red or black). Unusual bruising for unknown reasons. A serious fall or if  you hit your head (even if there is no bleeding).  Some medicines may interact with Xarelto and might increase your risk of bleeding while on Xarelto. To help avoid this, consult your healthcare provider or pharmacist prior to using any new prescription or non-prescription medications, including herbals, vitamins, non-steroidal anti-inflammatory drugs (NSAIDs) and supplements.  This website has more information on Xarelto: [email or web address]

## 2022-11-11 NOTE — Progress Notes (Signed)
Michael Alexander. to be D/C'd Home per MD order.  Discussed with the patient and all questions fully answered.  VSS, Skin clean, dry and intact without evidence of skin break down, no evidence of skin tears noted. IV catheter discontinued intact. Site without signs and symptoms of complications. Dressing and pressure applied.  An After Visit Summary was printed and given to the patient. Patient received prescription.  D/c education completed with patient/family including follow up instructions, medication list, d/c activities limitations if indicated, with other d/c instructions as indicated by MD - patient able to verbalize understanding, all questions fully answered.   Patient instructed to return to ED, call 911, or call MD for any changes in condition.   Patient escorted via [place], and D/C home via private auto.  [name] 11/11/2022 4:01 PM

## 2022-11-12 NOTE — Discharge Summary (Signed)
Physician Discharge Summary   Patient: Michael Alexander. 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 [facility] Course: The patient is a 57 yr old man with a past medical history of PE and interstitial lung disease. He presented to the ED on 11/10/2022 with complaints of shortness of breath and chest pain. He was found to have multiple scattered emboli in his pulmonary arteries. The patient had been prescribed xarelto for pulmonary emboli in the past. He states that for a period of days several weeks ago he was without this medication. Then this week he ran out of the medication and was not able to get it for about 3 days.   He was placed on a heparin drip and admitted to a telemetry bed.  On 11/11/2022 the patient was asymptomatic. He was feeling well. As the patient now had active pulmonary emboli he was discharged to home on a xarelto starter pack for loading and then treatment of the pulmonary emboli. He has been cautioned about taking his medication faithfully in the future.  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 He never was seen by hematology in regard to clotting disorder. 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 No worrisome or unexplained weight loss. No other signs or findings to suggestive malignancy. 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 Nl LE exam - no indication for LE venous  doppler.  Plan Heparin qtt 24 hrs  Resume xarelto  Limited activity 24 hrs  Coagulation panel  Outpatient follow up with hematology - he prefers to schedule at [facility]  Interstitial lung disease [facility]) Patient with sarcoid related interstial lung disease - he is at his baseline.  Plan Continue home regimen  GERD (gastroesophageal reflux disease) No active complaints. Decreased intake due to dental issues not GI issues  Plan Continue PPI tx         Consultants: None Procedures performed: None  Disposition: Home Diet recommendation:  Discharge Diet Orders (From admission, onward)     Start     Ordered   11/11/22 0000  Diet - low sodium heart healthy        11/11/22 1526           Cardiac diet DISCHARGE MEDICATION: Allergies as of 11/11/2022   No Known Allergies      Medication List     STOP taking these medications    doxycycline 100 MG capsule Commonly known as: VIBRAMYCIN   HYDROcodone-acetaminophen 5-325 MG tablet Commonly known as: Norco   rivaroxaban 10 MG Tabs tablet Commonly known as: XARELTO Replaced by: Xarelto Starter Pack       TAKE these medications    acetaminophen 325 MG tablet Commonly known as: TYLENOL Take 325-650 mg by mouth every 6 (six) hours as needed for headache or mild pain.   albuterol (2.5 MG/3ML) 0.083% nebulizer solution Commonly known as: PROVENTIL Take 2.5 mg by nebulization every 6 (six) hours as needed for wheezing or  shortness of breath.   albuterol 108 (90 Base) MCG/ACT inhaler Commonly known as: VENTOLIN HFA Inhale 2 puffs into the lungs every 4 (four) hours as needed for wheezing or shortness of breath.   BC HEADACHE POWDER PO Take 1 packet by mouth 2 (two) times daily as needed (for headaches or pain).   fluticasone-salmeterol 500-50 MCG/ACT Aepb Commonly known as: ADVAIR Inhale 1 puff into the lungs at bedtime.   PriLOSEC OTC 20 MG tablet Generic drug: omeprazole Take 20 mg by mouth at bedtime.    Xarelto Starter Pack Generic drug: Rivaroxaban Stater Pack (15 mg and 20 mg) Follow package directions: Take one 53m tablet by mouth twice a day. On day 22, switch to one 225mtablet once a day. Take with food. Replaces: rivaroxaban 10 MG Tabs tablet        Discharge Exam: Filed Weights   11/10/22 0818 11/10/22 1555  Weight: 84.4 kg 86 kg   Exam:  Constitutional:  The patient is awake, alert, and oriented x 3. No acute distress. Respiratory:  No increased work of breathing. No wheezes, rales, or rhonchi No tactile fremitus Cardiovascular:  Regular rate and rhythm No murmurs, ectopy, or gallups. No lateral PMI. No thrills. Abdomen:  Abdomen is soft, non-tender, non-distended No hernias, masses, or organomegaly Normoactive bowel sounds.  Musculoskeletal:  No cyanosis, clubbing, or edema Skin:  No rashes, lesions, ulcers palpation of skin: no induration or nodules Neurologic:  CN 2-12 intact Sensation all 4 extremities intact Psychiatric:  Mental status Mood, affect appropriate Orientation to person, place, time  judgment and insight appear intact  Condition at discharge: good  The results of significant diagnostics from this hospitalization (including imaging, microbiology, ancillary and laboratory) are listed below for reference.   Imaging Studies: CT Angio Chest PE W/Cm &/Or Wo Cm  Result Date: 11/10/2022 CLINICAL DATA:  Chest pain and SOB. EXAM: CT ANGIOGRAPHY CHEST WITH CONTRAST TECHNIQUE: Multidetector CT imaging of the chest was performed using the standard protocol during bolus administration of intravenous contrast. Multiplanar CT image reconstructions and MIPs were obtained to evaluate the vascular anatomy. RADIATION DOSE REDUCTION: This exam was performed according to the departmental dose-optimization program which includes automated exposure control, adjustment of the mA and/or kV according to patient size and/or use of iterative reconstruction  technique. CONTRAST:  75 mL OMNIPAQUE IOHEXOL 350 MG/ML SOLN COMPARISON:  12/20/2021 FINDINGS: Cardiovascular: Filling defects identified in bilateral pulmonary artery branches consistent with fairly extensive bilateral pulmonary emboli. No evidence of aortic aneurysm. No cardiomegaly or pericardial effusion. Contrast reflux to the IVC consistent with tricuspid insufficiency. Mediastinum/Nodes: No enlarged mediastinal, hilar, or axillary lymph nodes. Thyroid gland, trachea, and esophagus demonstrate no significant findings. Lungs/Pleura: There is pulmonary interstitial fibrosis with bibasilar and right apical honeycombing. This is stable from the prior study. Otherwise no evidence of pneumonia or pulmonary edema. No pleural effusion or pneumothorax. Upper Abdomen: No acute abnormality. Musculoskeletal: Minimal bilateral gynecomastia noted. Review of the MIP images confirms the above findings. IMPRESSION: 1. Multiple bilateral pulmonary emboli. 2. No evidence of aneurysm. 3. Mild tricuspid insufficiency. 4. Stable fibrosis. Findings were discussed with and acknowledged by  Dr. PeDene Gentrylectronically Signed   By: JoSammie Bench.D.   On: 11/10/2022 10:32   DG Chest 2 View  Result Date: 11/10/2022 CLINICAL DATA:  Acute chest pain and shortness of breath EXAM: CHEST - 2 VIEW COMPARISON:  12/20/2021 and prior studies FINDINGS: The cardiomediastinal silhouette is unchanged. Bibasilar interstitial/fibrotic changes are again noted. There is  no evidence of new pulmonary opacity, pneumothorax or pleural effusion. No acute bony abnormalities are present. IMPRESSION: 1. No acute cardiopulmonary disease. 2. Bibasilar interstitial/fibrosis changes. Electronically Signed   By: Margarette Canada M.D.   On: 11/10/2022 08:18    Microbiology: Results for orders placed or performed during the hospital encounter of 12/20/21  SARS CORONAVIRUS 2 (TAT 6-24 HRS) Nasopharyngeal Nasopharyngeal Swab     Status: None   Collection  Time: 12/20/21 10:41 AM   Specimen: Nasopharyngeal Swab  Result Value Ref Range Status   SARS Coronavirus 2 NEGATIVE NEGATIVE Final    Comment: (NOTE) SARS-CoV-2 target nucleic acids are NOT DETECTED.  The SARS-CoV-2 RNA is generally detectable in upper and lower respiratory specimens during the acute phase of infection. Negative results do not preclude SARS-CoV-2 infection, do not rule out co-infections with other pathogens, and should not be used as the sole basis for treatment or other patient management decisions. Negative results must be combined with clinical observations, patient history, and epidemiological information. The expected result is Negative.  Fact Sheet for Patients: SugarRoll.be  Fact Sheet for Healthcare Providers: https://www.woods-mathews.com/  This test is not yet approved or cleared by the [place] FDA and  has been authorized for detection and/or diagnosis of SARS-CoV-2 by FDA under an Emergency Use Authorization (EUA). This EUA will remain  in effect (meaning this test can be used) for the duration of the COVID-19 declaration under Se ction 564(b)(1) of the Act, 21 U.S.C. section 360bbb-3(b)(1), unless the authorization is terminated or revoked sooner.  Performed at [facility], [employer] [address] [place], [place] [address]     Labs: CBC: Recent Labs  Lab 11/10/22 0753 11/11/22 0125  WBC 7.9 7.6  HGB 15.4 14.7  HCT 46.3 44.0  MCV 94.9 94.0  PLT 302 [id number]   Basic Metabolic Panel: Recent Labs  Lab 11/10/22 0753 11/11/22 0125  NA 137 136  K 3.6 3.3*  CL 102 102  CO2 25 23  GLUCOSE 90 112*  BUN 7 6  CREATININE 0.97 0.93  CALCIUM 9.4 9.1   Liver Function Tests: Recent Labs  Lab 11/10/22 0753  AST 21  ALT 15  ALKPHOS 78  BILITOT 0.5  PROT 7.5  ALBUMIN 3.9   CBG: No results for input(s): "GLUCAP" in the last 168 hours.  Discharge time spent: greater than 30  minutes.  Signed: Karie Kirks, DO [facility] 11/12/2022

## 2022-11-13 LAB — PROTEIN S ACTIVITY: Protein S Activity: 111 % (ref 63–140)

## 2022-11-13 LAB — PROTEIN S, TOTAL: Protein S Ag, Total: 90 % (ref 60–150)

## 2022-11-13 LAB — PROTEIN C ACTIVITY: Protein C Activity: 111 % (ref 73–180)

## 2022-11-14 LAB — BETA-2-GLYCOPROTEIN I ABS, IGG/M/A
Beta-2 Glyco I IgG: <9 GPI IgG units (ref 0–20)
Beta-2-Glycoprotein I IgA: <9 GPI IgA units (ref 0–25)
Beta-2-Glycoprotein I IgM: <9 GPI IgM units (ref 0–32)

## 2022-11-14 LAB — PROTEIN C, TOTAL: Protein C, Total: 125 % (ref 60–150)

## 2022-11-20 LAB — VON WILLEBRAND PANEL
Coagulation Factor VIII: 83 % (ref 56–140)
Ristocetin Co-factor, Plasma: 52 % (ref 50–200)
Von Willebrand Antigen, Plasma: 170 % (ref 50–200)

## 2022-11-20 LAB — FACTOR 5 LEIDEN

## 2022-11-20 LAB — COAG STUDIES INTERP REPORT

## 2023-01-29 IMAGING — CT CT ANGIO CHEST
2 of 6 series · 17 of 46 positions shown · IV contrast (agent unspecified)
Comparison: None.

CLINICAL DATA: Pulmonary embolism (PE) suspected, high prob

EXAM:
CT ANGIOGRAPHY CHEST WITH CONTRAST
TECHNIQUE: Multidetector CT imaging of the chest was performed using the
standard protocol during bolus administration of intravenous
contrast. Multiplanar CT image reconstructions and MIPs were
obtained to evaluate the vascular anatomy.

[Series 6: thins · axial · 0.90mm/px · z∈[+1316,+1589]mm · 14 of 301 slices shown]
[im 14/301  lung]
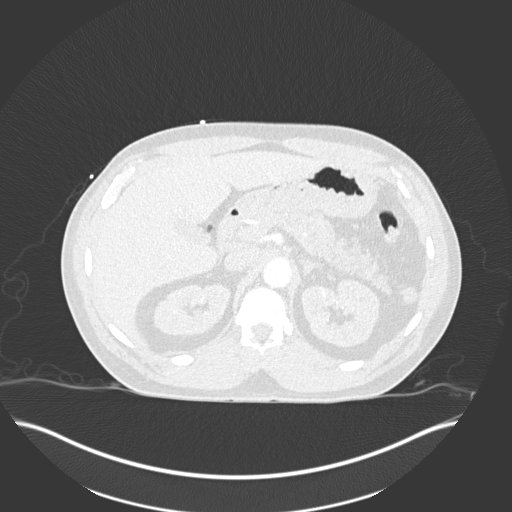
[im 40/301  soft-tissue]
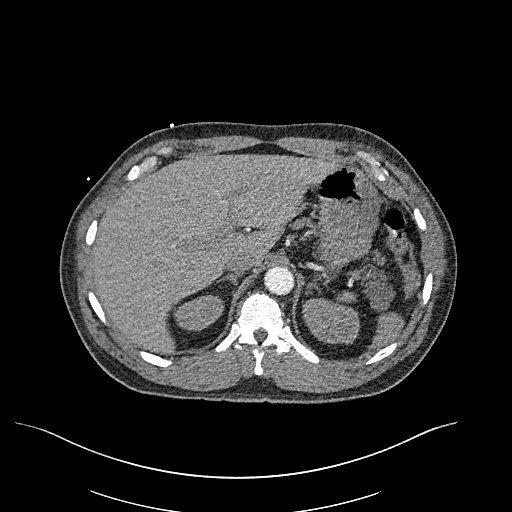
[im 53/301  lung]
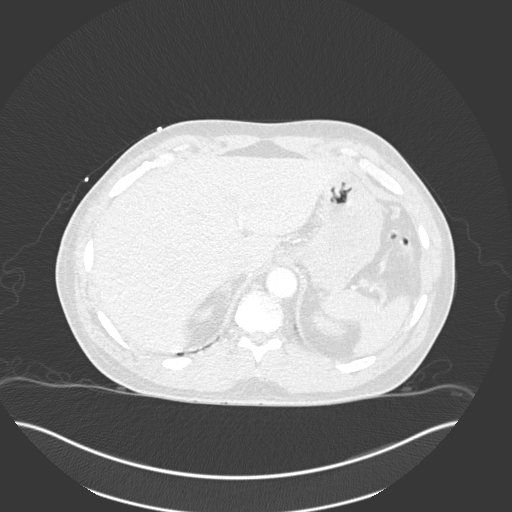
[im 79/301  soft-tissue]
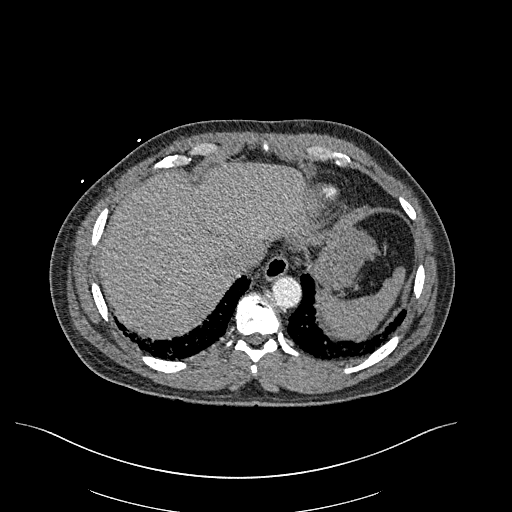
[im 105/301  lung]
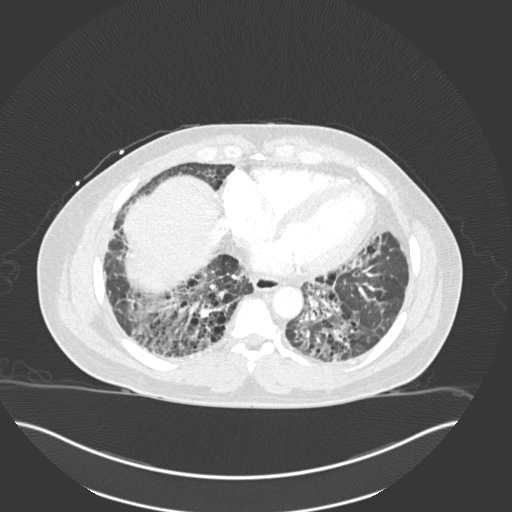
[im 118/301  soft-tissue]
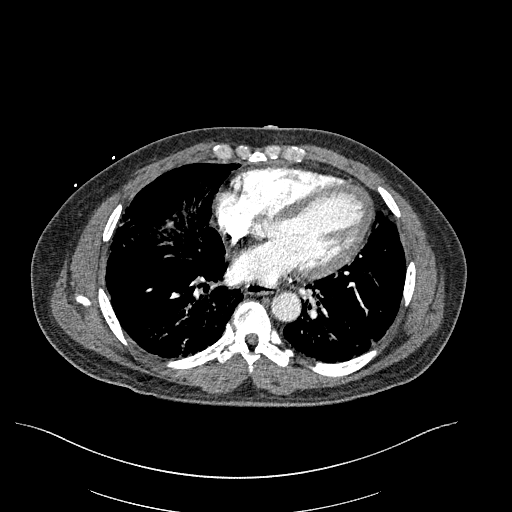
[im 144/301  lung]
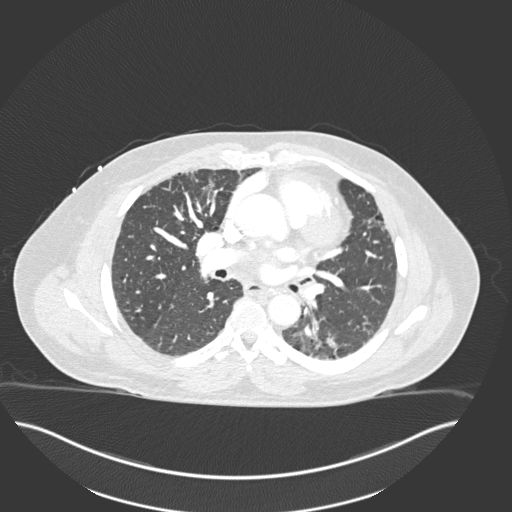
[im 157/301  soft-tissue]
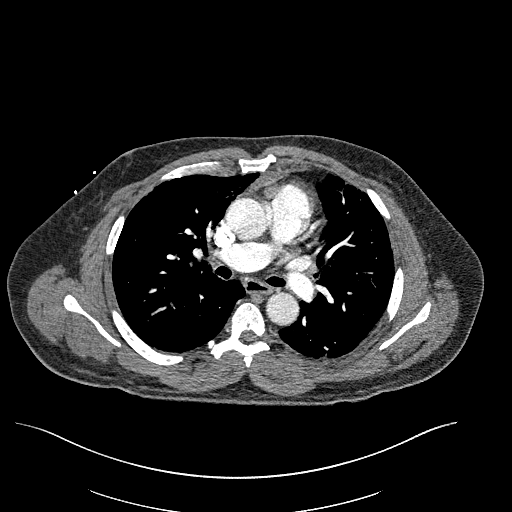
[im 183/301  lung]
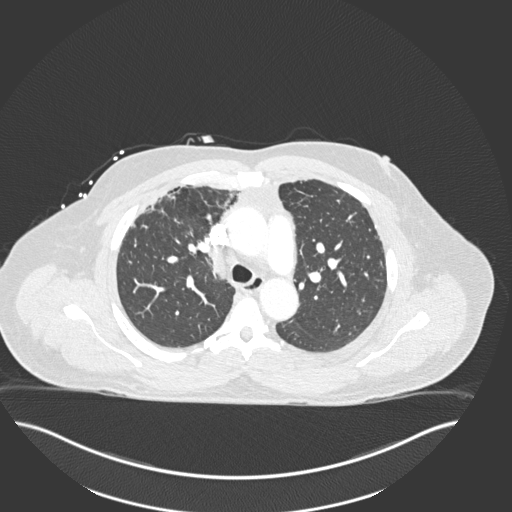
[im 196/301  soft-tissue]
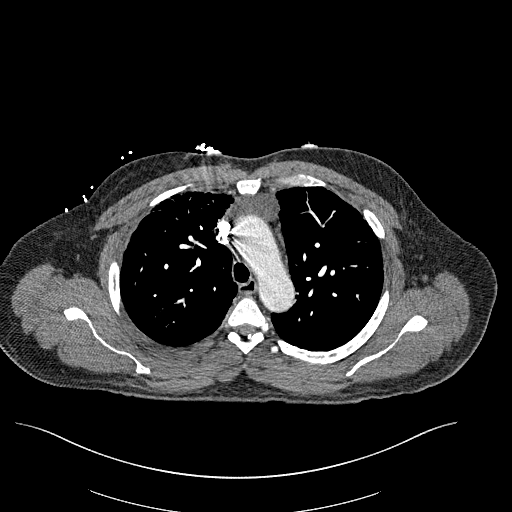
[im 222/301  lung]
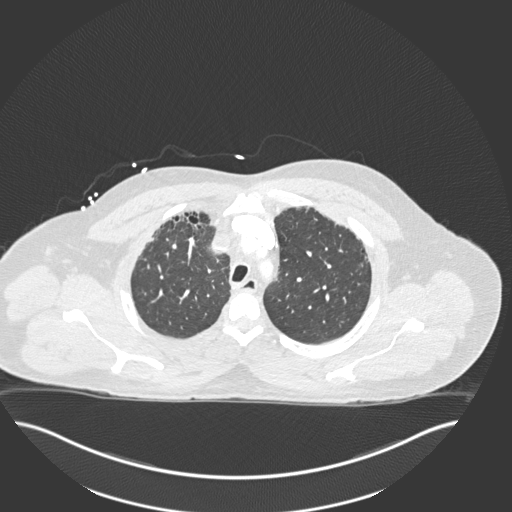
[im 248/301  soft-tissue]
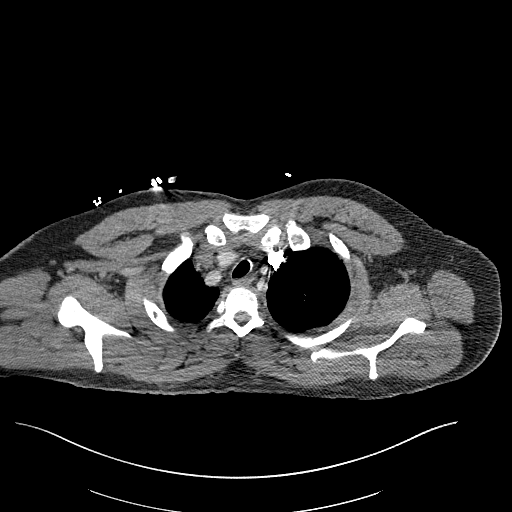
[im 261/301  lung]
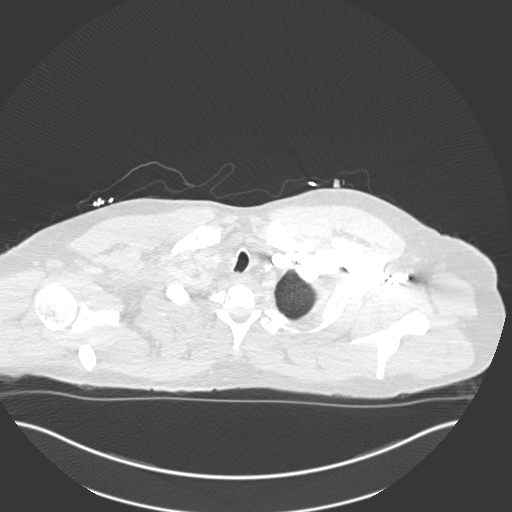
[im 287/301  soft-tissue]
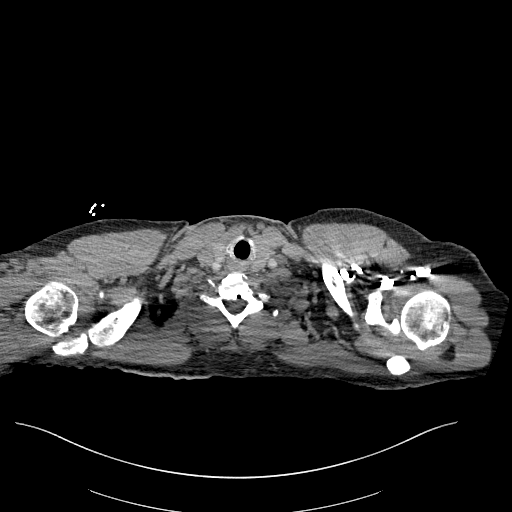

[Series 8: coronal mpr · coronal · 0.62mm/px · 3 of 147 slices shown]
[im 37/147  soft-tissue]
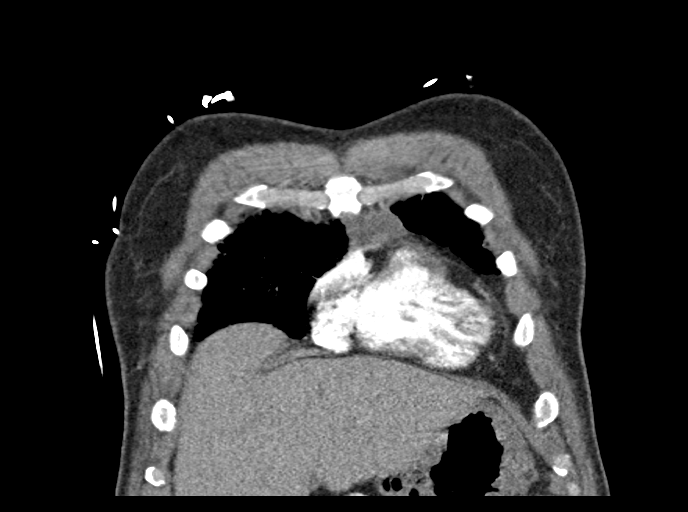
[im 74/147  soft-tissue]
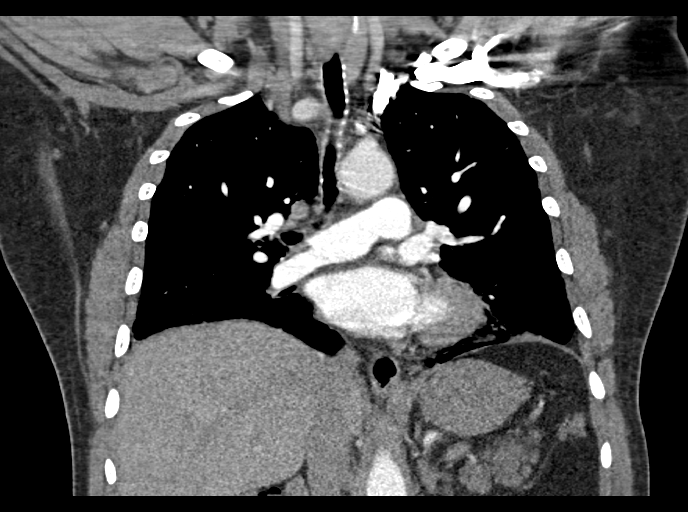
[im 110/147  soft-tissue]
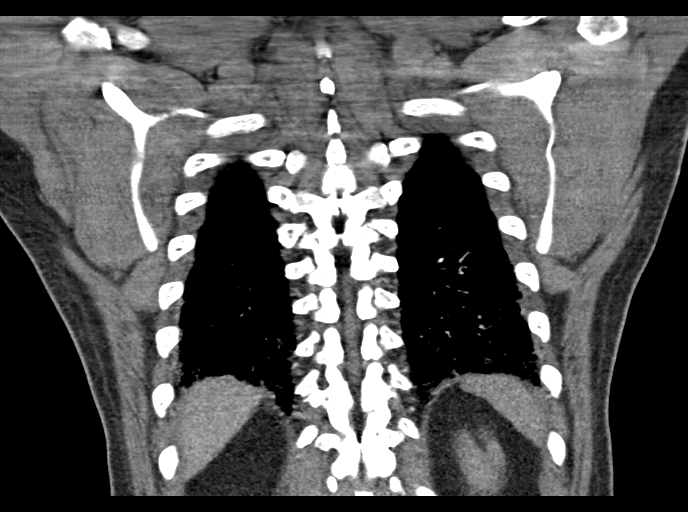

[17 of 46 positions shown; findings below may reference images not displayed]

RADIATION DOSE REDUCTION: This exam was performed according to the
departmental dose-optimization program which includes automated
exposure control, adjustment of the mA and/or kV according to
patient size and/or use of iterative reconstruction technique.

CONTRAST:  65mL OMNIPAQUE IOHEXOL 350 MG/ML SOLN
FINDINGS: Cardiovascular: Satisfactory opacification of the pulmonary arteries
to the level of the segmental branches in the upper lobes, with poor
opacification of the distal lower lobe segmental and subsegmental
vessels related to respiratory motion artifact and pulmonary
fibrosis. There is no evidence of pulmonary embolism within the well
opacified vessels. Normal cardiac size. No pericardial disease. Mild
coronary artery calcifications.

Mediastinum/Nodes: Mildly prominent hilar lymph nodes bilaterally,
likely reactive. There is a low-density mediastinal mass measuring
4.3 x 1.9 x 5.5 cm (series 6, image 112, sagittal image 112). This
is consistent with a thymic cyst.

Lungs/Pleura: There is there is a basilar predominant fibrotic lung
disease with cystic changes and peribronchovascular ground-glass
opacities. No definite honeycombing. Probable traction
bronchiectasis. There are is paraseptal emphysema/mild fibrotic
changes anteriorly in the upper lobes. There is a focal nodular
opacity within the right basilar fibrosis measuring 6 mm (series 7,
image 73). No pleural effusion. No pneumothorax.

Upper Abdomen: No acute findings.

Musculoskeletal: No acute osseous abnormality. No suspicious lytic
or blastic lesions. There is a vertebral body hemangioma of the T9
vertebral body.

Review of the MIP images confirms the above findings.
IMPRESSION: No evidence of pulmonary embolism, within the limitation of poor
opacification of the distal lower lobe segmental and subsegmental
branches related to respiratory motion artifact and pulmonary
fibrosis.

Basilar predominant fibrotic lung disease, consistent with probable
NSIP. Pulmonology referral and follow-up interstitial lung disease
protocol CT could be considered for further evaluation.

Nodular opacity within the right basilar fibrosis measuring 6 mm.
Non-contrast chest CT at 6-12 months is recommended. If the nodule
is stable at time of repeat CT, then future CT at 18-24 months (from
today's scan) is considered optional for low-risk patients, but is
recommended for high-risk patients. This recommendation follows the
consensus statement: Guidelines for Management of Incidental
Pulmonary Nodules Detected on CT Images: From the [HOSPITAL]

4.3 x 1.9 x 5.5 cm low-density mediastinal mass measuring water
density, consistent with a thymic cyst.

## 2023-02-05 ENCOUNTER — Emergency Department (HOSPITAL_COMMUNITY)
Admission: EM | Admit: 2023-02-05 | Discharge: 2023-02-05 | Disposition: A | Discharge status: Home / Self Care | Payer: 59 | Source: Home / Self Care | Attending: Emergency Medicine | Admitting: Emergency Medicine

## 2023-02-05 ENCOUNTER — Telehealth (HOSPITAL_COMMUNITY): Payer: Self-pay | Admitting: Pharmacy Technician

## 2023-02-05 ENCOUNTER — Encounter (HOSPITAL_COMMUNITY): Payer: Self-pay

## 2023-02-05 ENCOUNTER — Emergency Department (HOSPITAL_BASED_OUTPATIENT_CLINIC_OR_DEPARTMENT_OTHER): Payer: 59

## 2023-02-05 ENCOUNTER — Other Ambulatory Visit: Payer: Self-pay

## 2023-02-05 ENCOUNTER — Other Ambulatory Visit (HOSPITAL_COMMUNITY): Payer: Self-pay

## 2023-02-05 DIAGNOSIS — Z7901 Long term (current) use of anticoagulants: Secondary | ICD-10-CM | POA: Insufficient documentation

## 2023-02-05 DIAGNOSIS — Z7982 Long term (current) use of aspirin: Secondary | ICD-10-CM | POA: Insufficient documentation

## 2023-02-05 DIAGNOSIS — I82432 Acute embolism and thrombosis of left popliteal vein: Secondary | ICD-10-CM

## 2023-02-05 DIAGNOSIS — M79662 Pain in left lower leg: Secondary | ICD-10-CM | POA: Diagnosis not present

## 2023-02-05 DIAGNOSIS — I2699 Other pulmonary embolism without acute cor pulmonale: Secondary | ICD-10-CM | POA: Diagnosis not present

## 2023-02-05 DIAGNOSIS — I2609 Other pulmonary embolism with acute cor pulmonale: Secondary | ICD-10-CM | POA: Diagnosis not present

## 2023-02-05 MED ORDER — RIVAROXABAN 15 MG PO TABS
15.0000 mg | ORAL_TABLET | Freq: Two times a day (BID) | ORAL | Status: DC
  Administered 2023-02-05: 15 mg via ORAL
  Filled 2023-02-05: qty 1

## 2023-02-05 MED ORDER — RIVAROXABAN 10 MG PO TABS: 20.0000 mg | ORAL_TABLET | Freq: Every day | ORAL | Status: DC

## 2023-02-05 MED ORDER — RIVAROXABAN 15 MG PO TABS: 15.0000 mg | ORAL_TABLET | Freq: Two times a day (BID) | ORAL | 0 refills | Status: DC

## 2023-02-05 NOTE — TOC Benefit Eligibility Note (Addendum)
Patient Product/process development scientist completed.    The patient is currently admitted and upon discharge could be taking Xarelto 20 mg.  The current 30 day co-pay is $18.00.   The patient is currently admitted and upon discharge could be taking Eliquis 5 mg.  The current 30 day co-pay is $18.00.   The patient is insured through [employer]   This test claim was processed through [facility]- copay amounts may vary at other pharmacies due to pharmacy/plan contracts, or as the patient moves through the different stages of their insurance plan.  Roland Earl, CPHT Pharmacy Patient Advocate Specialist [facility] Patient Advocate Team Direct Number: [phone]  Fax: [phone]

## 2023-02-05 NOTE — ED Triage Notes (Signed)
Pt started having left leg pain on Monday. No injury to leg. Pt is ambulatory. Pain is 7/10 but while walking is 10/10. OTC meds work a little

## 2023-02-05 NOTE — Telephone Encounter (Signed)
Pharmacy Patient Advocate Encounter  Insurance verification completed.    The patient is insured through [employer]   The patient is currently admitted and ran test claims for the following: Xarelto.  Copays and coinsurance results were relayed to Inpatient clinical team.      

## 2023-02-05 NOTE — Progress Notes (Signed)
Lower extremity venous left study completed.  Preliminary results relayed to [place], [place] and Hyacinth Meeker, MD.   See CV Proc for preliminary results report.   Jean Rosenthal, RDMS, RVT

## 2023-02-05 NOTE — ED Notes (Signed)
DC instructions reviewed with pt. PT verbalized understanding. PT DC °

## 2023-02-05 NOTE — Discharge Instructions (Addendum)
As we discussed, call hematologist today to schedule appointment for this week. For the next 3 weeks take Xarelto 15mg  twice a day. Your doctor will need to manage your dosing after that.  ______________________________________________________________________________________________________________________________________ Information on my medicine - XARELTO (rivaroxaban)  This medication education was reviewed with me or my healthcare representative as part of my discharge preparation.   WHY WAS XARELTO PRESCRIBED FOR YOU? Xarelto was prescribed to treat blood clots that may have been found in the veins of your legs (deep vein thrombosis) or in your lungs (pulmonary embolism) and to reduce the risk of them occurring again.  What do you need to know about Xarelto? The starting dose is one 15 mg tablet taken TWICE daily with food for the FIRST 21 DAYS then on [facility], 02/26/23  the dose is changed to one 20 mg tablet taken ONCE A DAY with your evening meal.  DO NOT stop taking Xarelto without talking to the health care provider who prescribed the medication.  Refill your prescription for 20 mg tablets before you run out.  After discharge, you should have regular check-up appointments with your healthcare provider that is prescribing your Xarelto.  In the future your dose may need to be changed if your kidney function changes by a significant amount.  What do you do if you miss a dose? If you are taking Xarelto TWICE DAILY and you miss a dose, take it as soon as you remember. You may take two 15 mg tablets (total 30 mg) at the same time then resume your regularly scheduled 15 mg twice daily the next day.  If you are taking Xarelto ONCE DAILY and you miss a dose, take it as soon as you remember on the same day then continue your regularly scheduled once daily regimen the next day. Do not take two doses of Xarelto at the same time.   Important Safety Information Xarelto is a blood  thinner medicine that can cause bleeding. You should call your healthcare provider right away if you experience any of the following: Bleeding from an injury or your nose that does not stop. Unusual colored urine (red or dark brown) or unusual colored stools (red or black). Unusual bruising for unknown reasons. A serious fall or if you hit your head (even if there is no bleeding).  Some medicines may interact with Xarelto and might increase your risk of bleeding while on Xarelto. To help avoid this, consult your healthcare provider or pharmacist prior to using any new prescription or non-prescription medications, including herbals, vitamins, non-steroidal anti-inflammatory drugs (NSAIDs) and supplements.  This website has more information on Xarelto: VisitDestination.com.br.

## 2023-02-05 NOTE — ED Provider Notes (Signed)
Patient is a well-appearing 57 year old male history of thromboembolism currently on Xarelto, cannot remember the last time he missed a dose, recent cruise lasting several days where he did excessive walking.  Reports that he has had some increasing pain behind his left knee with fullness.  On exam he does have a fullness in the popliteal fossa but totally normal strength sensation and pulses to the leg.  There is no edema and there is no asymmetry except for that popliteal area which appears full.  He otherwise appears well with no tachycardia no hypoxia no shortness of breath.  He is mildly hypertensive.  Will order ultrasound to make sure there is no signs of Baker's cyst or DVT.  Patient agreeable.  Imaging by radiography on useful given that this is unlikely to be bony and he does not have a clinical effusion.  He patient does have full range of motion of the knee with discomfort with full flexion.  He has had recurrent DVTs going back to 2022 and then again in 2024 and has had a hypercoagulable workup secondary to his underlying history of unprovoked thromboembolism as well as interstitial lung disease, sarcoidosis, polymyositis that have all been diagnosed in the past.  Ultrasound shows that the patient has a age-indeterminate popliteal vein thrombosis, it is unclear whether this is completely or partially occlusive but the patient has normal-appearing legs otherwise with no edema and great pulses.  Will discuss with vascular surgery as the patient does remark that he is up-to-date on his Xarelto use and has not missed any doses.  He has been relatively immobilized after a trip to [place] to go on a cruise but no other significant increased risk.   Consultations: Discussed with Vascular surgery, they recommend no need for patient follow-up as this is a small area and only involves the popliteal vein.  Will discuss with heme-onc regarding medication changes  Discussed with Hem Onc: They recommend that  the patient go back on Xarelto 15 mg twice daily or switch to Coumadin, after discussion with the patient and shared decision making he is requested to go to twice a day Xarelto and follow-up with his hematologist at [facility].  He has the ability to follow-up he has their phone number and is willing to do so this week.  He has no signs of pulmonary embolism, stable for discharge  Medical screening examination/treatment/procedure(s) were conducted as a shared visit with non-physician practitioner(s) and myself.  I personally evaluated the patient during the encounter.  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

## 2023-02-05 NOTE — ED Provider Notes (Signed)
[place] EMERGENCY DEPARTMENT AT [facility] Provider Note   CSN: [id number] Arrival date & time: 02/05/23  0557     History  Chief Complaint  Patient presents with   Leg Pain    Michael Talford. is a 57 y.o. male with a history of sarcoidosis and two unprovoked pulmonary embolisms who presents today with pain to the back of the left knee for the past three days. Patient reports he first noticed the pain when he was getting off a cruise, which intensified while flying home from [place]. He has taken Tylenol twice for the pain without relief. Patient is experiencing localized, persistent pain in the left popliteal area that worsens with walking. He reports taking Xarelto daily without missing any recent doses. No other concerns or complaints at this time.     Home Medications Prior to Admission medications   Medication Sig Start Date End Date Taking? Authorizing Provider  acetaminophen (TYLENOL) 325 MG tablet Take 325-650 mg by mouth every 6 (six) hours as needed for headache or mild pain.    [provider]  albuterol (PROVENTIL) (2.5 MG/3ML) 0.083% nebulizer solution Take 2.5 mg by nebulization every 6 (six) hours as needed for wheezing or shortness of breath.    [provider]  albuterol (VENTOLIN HFA) 108 (90 Base) MCG/ACT inhaler Inhale 2 puffs into the lungs every 4 (four) hours as needed for wheezing or shortness of breath. 12/20/21   Arby Barrette, MD  Aspirin-Salicylamide-Caffeine (BC HEADACHE POWDER PO) Take 1 packet by mouth 2 (two) times daily as needed (for headaches or pain).    [provider]  fluticasone-salmeterol (ADVAIR) 500-50 MCG/ACT AEPB Inhale 1 puff into the lungs at bedtime. 01/20/20   [provider]  PRILOSEC OTC 20 MG tablet Take 20 mg by mouth at bedtime.    [provider]  RIVAROXABAN Carlena Hurl) VTE STARTER PACK (15 & 20 MG) Follow package directions: Take one 15mg  tablet by mouth twice a day. On day  22, switch to one 20mg  tablet once a day. Take with food. 11/11/22   Swayze, Ava, DO      Allergies    Patient has no known allergies.    Review of Systems   Review of Systems  Musculoskeletal:  Negative for arthralgias and myalgias.       Positive for posterior left knee pain  Skin:  Negative for color change, rash and wound.  Neurological:  Negative for weakness and numbness.    Physical Exam Updated Vital Signs BP (!) 155/111   Pulse 88   Temp 98.1 F (36.7 C) (Oral)   Resp 20   Ht 5\' 8"  (1.727 m)   Wt 86 kg   SpO2 100%   BMI 28.83 kg/m  Physical Exam Vitals reviewed.  Constitutional:      Appearance: Normal appearance.  Musculoskeletal:     Comments: Posterior left knee edema  Neurological:     Mental Status: He is alert.     ED Results / Procedures / Treatments   Labs (all labs ordered are listed, but only abnormal results are displayed) Labs Reviewed - No data to display  EKG None  Radiology VAS [place] LOWER EXTREMITY VENOUS (DVT)  Result Date: 02/05/2023  Lower Venous DVT Study Patient Name:  Michael Alexander.  Date of Exam:   02/05/2023 Medical Rec #: [id number]           Accession #:    [id number] Date of Birth: Feb 27, 1966  Patient Gender: M Patient Age:   55 years Exam Location:  [facility] Procedure:      VAS [place] LOWER EXTREMITY VENOUS (DVT) Referring Phys: Maxwell Marion --------------------------------------------------------------------------------  Indications: Left knee pain x 2 days, history of PE. Other Indications: History of PE - most recent CTA on 11-10-2022. Performing Technologist: Jean Rosenthal RDMS, RVT  Examination Guidelines: A complete evaluation includes B-mode imaging, spectral Doppler, color Doppler, and power Doppler as needed of all accessible portions of each vessel. Bilateral testing is considered an integral part of a complete examination. Limited examinations for reoccurring indications may be performed as noted. The reflux  portion of the exam is performed with the patient in reverse Trendelenburg.  +-----+---------------+---------+-----------+----------+--------------+ RIGHTCompressibilityPhasicitySpontaneityPropertiesThrombus Aging +-----+---------------+---------+-----------+----------+--------------+ CFV  Full           Yes      Yes                                 +-----+---------------+---------+-----------+----------+--------------+   +---------+---------------+---------+-----------+----------+-----------------+ LEFT     CompressibilityPhasicitySpontaneityPropertiesThrombus Aging    +---------+---------------+---------+-----------+----------+-----------------+ CFV      Full           Yes      Yes                                    +---------+---------------+---------+-----------+----------+-----------------+ SFJ      Full                                                           +---------+---------------+---------+-----------+----------+-----------------+ FV Prox  Full                                                           +---------+---------------+---------+-----------+----------+-----------------+ FV Mid   Full                                                           +---------+---------------+---------+-----------+----------+-----------------+ FV DistalFull                                                           +---------+---------------+---------+-----------+----------+-----------------+ PFV      Full                                                           +---------+---------------+---------+-----------+----------+-----------------+ POP      Partial        Yes      Yes  Age Indeterminate +---------+---------------+---------+-----------+----------+-----------------+ PTV      Full                                                           +---------+---------------+---------+-----------+----------+-----------------+ PERO      Full                                                           +---------+---------------+---------+-----------+----------+-----------------+ Gastroc  Full                               [place]                    +---------+---------------+---------+-----------+----------+-----------------+     Summary: RIGHT: - No evidence of common femoral vein obstruction.  LEFT: - Findings consistent with age indeterminate deep vein thrombosis involving the left popliteal vein. - No cystic structure found in the popliteal fossa.  *See table(s) above for measurements and observations.    Preliminary     Procedures Procedures: Not indicated.   Medications Ordered in ED Medications  Rivaroxaban (XARELTO) tablet 15 mg (has no administration in time range)    Followed by  rivaroxaban (XARELTO) tablet 20 mg (has no administration in time range)    ED Course/ Medical Decision Making/ A&P                             Medical Decision Making Risk Prescription drug management.   Patient presents to the ED today for localized and persistent left popliteal pain for the past 3 days after returning from a cruise in [place], [place]. He has a history of 2 unprovoked pulmonary embolisms in the past as well as sarcoidosis. Differentials include DVT vs Baker cyst vs cellulitis vs gout vs septic arthritis. Left lower extremity U/S revealed age-indeterminate popliteal vein thrombosis. Vascular surgery and Hem Onc consulted. Vascular surgery cleared patient and Hem Onc recommended patient take Xarelto 15mg  BID or start Warfarin. Patient wished to stay on Xarelto. Prescription was sent and patient given strict instructed to follow up with hematologist at [facility] this week.          Final Clinical Impression(s) / ED Diagnoses Final diagnoses:  Deep vein thrombosis (DVT) of popliteal vein of left lower extremity, unspecified chronicity [facility])    Rx / DC Orders ED Discharge Orders     None          Maxwell Marion, PA-C 02/05/23 1501    Eber Hong, MD 02/05/23 2004

## 2023-02-06 ENCOUNTER — Other Ambulatory Visit: Payer: Self-pay

## 2023-02-06 ENCOUNTER — Inpatient Hospital Stay (HOSPITAL_COMMUNITY)
Admission: EM | Admit: 2023-02-06 | Discharge: 2023-02-09 | DRG: 175 | Disposition: A | Discharge status: Home / Self Care | Payer: 59 | Attending: Student | Admitting: Student

## 2023-02-06 ENCOUNTER — Encounter (HOSPITAL_COMMUNITY): Payer: Self-pay | Admitting: Emergency Medicine

## 2023-02-06 DIAGNOSIS — E876 Hypokalemia: Secondary | ICD-10-CM | POA: Diagnosis present

## 2023-02-06 DIAGNOSIS — E781 Pure hyperglyceridemia: Secondary | ICD-10-CM | POA: Diagnosis present

## 2023-02-06 DIAGNOSIS — I2699 Other pulmonary embolism without acute cor pulmonale: Secondary | ICD-10-CM | POA: Diagnosis present

## 2023-02-06 DIAGNOSIS — Z86718 Personal history of other venous thrombosis and embolism: Secondary | ICD-10-CM

## 2023-02-06 DIAGNOSIS — I82432 Acute embolism and thrombosis of left popliteal vein: Secondary | ICD-10-CM | POA: Diagnosis present

## 2023-02-06 DIAGNOSIS — Z86711 Personal history of pulmonary embolism: Secondary | ICD-10-CM

## 2023-02-06 DIAGNOSIS — Z7901 Long term (current) use of anticoagulants: Secondary | ICD-10-CM

## 2023-02-06 DIAGNOSIS — Z79899 Other long term (current) drug therapy: Secondary | ICD-10-CM

## 2023-02-06 DIAGNOSIS — D869 Sarcoidosis, unspecified: Secondary | ICD-10-CM | POA: Diagnosis present

## 2023-02-06 DIAGNOSIS — K219 Gastro-esophageal reflux disease without esophagitis: Secondary | ICD-10-CM | POA: Diagnosis present

## 2023-02-06 DIAGNOSIS — J849 Interstitial pulmonary disease, unspecified: Secondary | ICD-10-CM | POA: Diagnosis present

## 2023-02-06 DIAGNOSIS — I824Y2 Acute embolism and thrombosis of unspecified deep veins of left proximal lower extremity: Secondary | ICD-10-CM | POA: Insufficient documentation

## 2023-02-06 DIAGNOSIS — I2609 Other pulmonary embolism with acute cor pulmonale: Secondary | ICD-10-CM | POA: Diagnosis present

## 2023-02-06 DIAGNOSIS — Z7951 Long term (current) use of inhaled steroids: Secondary | ICD-10-CM

## 2023-02-06 DIAGNOSIS — I82412 Acute embolism and thrombosis of left femoral vein: Secondary | ICD-10-CM | POA: Diagnosis present

## 2023-02-06 NOTE — ED Triage Notes (Signed)
Pt arrives via POV d/t concerns for blood clots. States left lower leg pain that has progressed into the upper leg since he was evaluated along with concern for PR s/t SOB. Denies CP. No resp distress in triage.   Has US DVT yesterday. + DVT, states compliance with xarelto.

## 2023-02-07 ENCOUNTER — Emergency Department (HOSPITAL_COMMUNITY): Payer: 59

## 2023-02-07 ENCOUNTER — Observation Stay (HOSPITAL_COMMUNITY): Payer: 59

## 2023-02-07 DIAGNOSIS — Z79899 Other long term (current) drug therapy: Secondary | ICD-10-CM | POA: Diagnosis not present

## 2023-02-07 DIAGNOSIS — I82412 Acute embolism and thrombosis of left femoral vein: Secondary | ICD-10-CM | POA: Diagnosis present

## 2023-02-07 DIAGNOSIS — E781 Pure hyperglyceridemia: Secondary | ICD-10-CM | POA: Diagnosis present

## 2023-02-07 DIAGNOSIS — I824Y2 Acute embolism and thrombosis of unspecified deep veins of left proximal lower extremity: Secondary | ICD-10-CM | POA: Diagnosis not present

## 2023-02-07 DIAGNOSIS — I2609 Other pulmonary embolism with acute cor pulmonale: Secondary | ICD-10-CM | POA: Diagnosis present

## 2023-02-07 DIAGNOSIS — D869 Sarcoidosis, unspecified: Secondary | ICD-10-CM | POA: Diagnosis present

## 2023-02-07 DIAGNOSIS — K219 Gastro-esophageal reflux disease without esophagitis: Secondary | ICD-10-CM | POA: Diagnosis present

## 2023-02-07 DIAGNOSIS — Z7951 Long term (current) use of inhaled steroids: Secondary | ICD-10-CM | POA: Diagnosis not present

## 2023-02-07 DIAGNOSIS — E876 Hypokalemia: Secondary | ICD-10-CM | POA: Diagnosis present

## 2023-02-07 DIAGNOSIS — J849 Interstitial pulmonary disease, unspecified: Secondary | ICD-10-CM | POA: Diagnosis present

## 2023-02-07 DIAGNOSIS — M79662 Pain in left lower leg: Secondary | ICD-10-CM | POA: Diagnosis not present

## 2023-02-07 DIAGNOSIS — I82432 Acute embolism and thrombosis of left popliteal vein: Secondary | ICD-10-CM | POA: Diagnosis present

## 2023-02-07 DIAGNOSIS — Z86711 Personal history of pulmonary embolism: Secondary | ICD-10-CM | POA: Diagnosis not present

## 2023-02-07 DIAGNOSIS — I2699 Other pulmonary embolism without acute cor pulmonale: Secondary | ICD-10-CM | POA: Diagnosis present

## 2023-02-07 DIAGNOSIS — Z86718 Personal history of other venous thrombosis and embolism: Secondary | ICD-10-CM | POA: Diagnosis not present

## 2023-02-07 DIAGNOSIS — Z7901 Long term (current) use of anticoagulants: Secondary | ICD-10-CM | POA: Diagnosis not present

## 2023-02-07 LAB — BASIC METABOLIC PANEL
Anion gap: 10 (ref 5–15)
BUN: 7 mg/dL (ref 6–20)
CO2: 28 mmol/L (ref 22–32)
Calcium: 9.2 mg/dL (ref 8.9–10.3)
Chloride: 98 mmol/L (ref 98–111)
Creatinine, Ser: 0.99 mg/dL (ref 0.61–1.24)
GFR, Estimated: >60 mL/min (ref >60)
Glucose, Bld: 123 mg/dL — ABNORMAL HIGH (ref 70–99)
Potassium: 3.4 mmol/L — ABNORMAL LOW (ref 3.5–5.1)
Sodium: 136 mmol/L (ref 135–145)

## 2023-02-07 LAB — COMPREHENSIVE METABOLIC PANEL
ALT: 14 U/L (ref 0–44)
AST: 16 U/L (ref 15–41)
Albumin: 3.1 g/dL — ABNORMAL LOW (ref 3.5–5.0)
Alkaline Phosphatase: 68 U/L (ref 38–126)
Anion gap: 12 (ref 5–15)
BUN: 6 mg/dL (ref 6–20)
CO2: 28 mmol/L (ref 22–32)
Calcium: 9.1 mg/dL (ref 8.9–10.3)
Chloride: 97 mmol/L — ABNORMAL LOW (ref 98–111)
Creatinine, Ser: 0.94 mg/dL (ref 0.61–1.24)
GFR, Estimated: >60 mL/min (ref >60)
Glucose, Bld: 100 mg/dL — ABNORMAL HIGH (ref 70–99)
Potassium: 3.2 mmol/L — ABNORMAL LOW (ref 3.5–5.1)
Sodium: 137 mmol/L (ref 135–145)
Total Bilirubin: 0.7 mg/dL (ref 0.3–1.2)
Total Protein: 7 g/dL (ref 6.5–8.1)

## 2023-02-07 LAB — CBC
HCT: 39.5 % (ref 39.0–52.0)
HCT: 39.6 % (ref 39.0–52.0)
Hemoglobin: 13.2 g/dL (ref 13.0–17.0)
Hemoglobin: 14.1 g/dL (ref 13.0–17.0)
MCH: 31 pg (ref 26.0–34.0)
MCH: 32.5 pg (ref 26.0–34.0)
MCHC: 33.3 g/dL (ref 30.0–36.0)
MCHC: 35.7 g/dL (ref 30.0–36.0)
MCV: 91 fL (ref 80.0–100.0)
MCV: 93 fL (ref 80.0–100.0)
Platelets: 290 10*3/uL (ref 150–400)
Platelets: 295 10*3/uL (ref 150–400)
RBC: 4.26 MIL/uL (ref 4.22–5.81)
RBC: 4.34 MIL/uL (ref 4.22–5.81)
RDW: 12.8 % (ref 11.5–15.5)
RDW: 13 % (ref 11.5–15.5)
WBC: 10.8 10*3/uL — ABNORMAL HIGH (ref 4.0–10.5)
WBC: 9.7 10*3/uL (ref 4.0–10.5)
nRBC: 0 % (ref 0.0–0.2)
nRBC: 0 % (ref 0.0–0.2)

## 2023-02-07 LAB — APTT: aPTT: 65 seconds — ABNORMAL HIGH (ref 24–36)

## 2023-02-07 LAB — LACTIC ACID, PLASMA: Lactic Acid, Venous: 0.3 mmol/L — ABNORMAL LOW (ref 0.5–1.9)

## 2023-02-07 LAB — BRAIN NATRIURETIC PEPTIDE: B Natriuretic Peptide: 9.8 pg/mL (ref 0.0–100.0)

## 2023-02-07 LAB — PHOSPHORUS: Phosphorus: 4.7 mg/dL — ABNORMAL HIGH (ref 2.5–4.6)

## 2023-02-07 LAB — TROPONIN I (HIGH SENSITIVITY)
Troponin I (High Sensitivity): 3 ng/L (ref <18)
Troponin I (High Sensitivity): 3 ng/L (ref <18)

## 2023-02-07 LAB — HEPARIN LEVEL (UNFRACTIONATED): Heparin Unfractionated: >1.1 IU/mL — ABNORMAL HIGH (ref 0.30–0.70)

## 2023-02-07 LAB — PROTIME-INR
INR: 1.4 — ABNORMAL HIGH (ref 0.8–1.2)
Prothrombin Time: 17.5 seconds — ABNORMAL HIGH (ref 11.4–15.2)

## 2023-02-07 LAB — MAGNESIUM: Magnesium: 2 mg/dL (ref 1.7–2.4)

## 2023-02-07 MED ORDER — KETOROLAC TROMETHAMINE 15 MG/ML IJ SOLN
15.0000 mg | Freq: Three times a day (TID) | INTRAMUSCULAR | Status: DC | PRN
  Administered 2023-02-07 – 2023-02-08 (×2): 15 mg via INTRAVENOUS
  Filled 2023-02-07 (×3): qty 1

## 2023-02-07 MED ORDER — ACETAMINOPHEN 325 MG PO TABS: 650.0000 mg | ORAL_TABLET | Freq: Four times a day (QID) | ORAL | Status: DC | PRN

## 2023-02-07 MED ORDER — ONDANSETRON HCL 4 MG/2ML IJ SOLN
4.0000 mg | Freq: Once | INTRAMUSCULAR | Status: AC
  Administered 2023-02-07: 4 mg via INTRAVENOUS
  Filled 2023-02-07: qty 2

## 2023-02-07 MED ORDER — HYDROMORPHONE HCL 1 MG/ML IJ SOLN
0.5000 mg | INTRAMUSCULAR | Status: DC | PRN
  Administered 2023-02-07 – 2023-02-08 (×2): 0.5 mg via INTRAVENOUS
  Filled 2023-02-07: qty 1
  Filled 2023-02-07 (×2): qty 0.5

## 2023-02-07 MED ORDER — HEPARIN (PORCINE) 25000 UT/250ML-% IV SOLN
1450.0000 [IU]/h | INTRAVENOUS | Status: AC
  Administered 2023-02-07: 1300 [IU]/h via INTRAVENOUS
  Administered 2023-02-08 (×2): 1450 [IU]/h via INTRAVENOUS
  Filled 2023-02-07 (×3): qty 250

## 2023-02-07 MED ORDER — POTASSIUM CHLORIDE CRYS ER 20 MEQ PO TBCR
40.0000 meq | EXTENDED_RELEASE_TABLET | Freq: Once | ORAL | Status: AC
  Administered 2023-02-07: 40 meq via ORAL
  Filled 2023-02-07: qty 2

## 2023-02-07 MED ORDER — POTASSIUM CHLORIDE CRYS ER 20 MEQ PO TBCR
40.0000 meq | EXTENDED_RELEASE_TABLET | ORAL | Status: AC
  Administered 2023-02-07 (×2): 40 meq via ORAL
  Filled 2023-02-07 (×2): qty 2

## 2023-02-07 MED ORDER — IOHEXOL 350 MG/ML SOLN
75.0000 mL | Freq: Once | INTRAVENOUS | Status: AC | PRN
  Administered 2023-02-07: 75 mL via INTRAVENOUS

## 2023-02-07 MED ORDER — MORPHINE SULFATE (PF) 4 MG/ML IV SOLN
4.0000 mg | Freq: Once | INTRAVENOUS | Status: AC
  Administered 2023-02-07: 4 mg via INTRAVENOUS
  Filled 2023-02-07: qty 1

## 2023-02-07 NOTE — Progress Notes (Signed)
ANTICOAGULATION CONSULT NOTE Pharmacy Consult for heparin Indication: pulmonary embolus and DVT  No Known Allergies  Patient Measurements:   Heparin Dosing Weight: 86Kg  Vital Signs: Temp: 97.8 F (36.6 C) (05/10 1454) Temp Source: Axillary (05/10 1454) BP: 155/107 (05/10 1454) Pulse Rate: 73 (05/10 1454)  Labs: Recent Labs    02/06/23 2351 02/07/23 0737 02/07/23 1533  HGB 14.1 13.2  --   HCT 39.5 39.6  --   PLT 295 290  --   APTT  --   --  65*  LABPROT  --   --  17.5*  INR  --   --  1.4*  HEPARINUNFRC  --   --  >1.10*  CREATININE 0.99 0.94  --   TROPONINIHS 3 3  --      Estimated Creatinine Clearance: 92.5 mL/min (by C-G formula based on SCr of 0.94 mg/dL).   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 male presenting to ED with increasing leg pain as well as chest pain and shortness of breath. Patient with history of prior PE in Feb 2024 on rivaroxaban prior to admission (Last dose 02/06/23 @2200 ). Dose recently increase to 15mg  BID following hematology consultation. Per discussion with EDP this is a true rivaroxaban failure and the plan will be to eventually transition to warfarin with a heparin bridge. The EDP would like to start heparin therapy immediately given new PE/DVT while taking rivaroxaban. Pharmacy to dose heparin. Per EDP request, no initial bolus. CBC WNL  PTT 65 seconds, heparin level falsely elevated  Goal of Therapy:  Heparin level 0.3-0.7 units/ml aPTT 66-102 units/ml Monitor platelets by anticoagulation protocol: Yes   Plan:  Increase heparin to 1450 units / hr Follow up AM labs  Thank you Okey Regal, PharmD  02/07/2023 4:39  PM Please check AMION for all [facility] Pharmacy numbers

## 2023-02-07 NOTE — H&P (Signed)
History and Physical    [name]. ZOX:[id number] DOB: 09/22/1966 DOA: 02/06/2023  PCP: Dione Housekeeper, MD Patient coming from: Home.  Chief Complaint: Left leg pain and swelling  HPI:  Michael Alexander. is 56 year old M with PMH of PE and DVT on Xarelto and ILD presenting with progressive left lower extremity pain and swelling.  Patient was seen in ED on 5/8.  At that time, lower extremity Doppler positive for age-indeterminate left popliteal vein DVT.  His Xarelto was changed to starter pack.  Patient continued to feel progressive left lower extremity pain despite compliance with Xarelto.  Patient was initially behind his knee and below but progressed up to his left thigh over the last 2 days.  He describes the pain as throbbing.  Rates his pain 7/10.  He denies chest pain, shortness of breath or cough.  Of note, he was hospitalized in 10/2022 due to recurrent PE while on low-dose Xarelto.   Patient denies dizziness, nausea, vomiting, abdominal pain or UTI symptoms.  Denies history of GI bleed.  Denies recent travel.  Denies history of cancer.  Denies family history of blood clot.  Reports normal colonoscopy few years ago.  Patient denies smoking cigarette.  Administered drinking alcohol.  Denies recreational drug use.  Likes to be full code.  In ED, vital stable except for mild elevated BP.  100% on RA.  K3.4.  Glucose 123.  WBC 10.8.  Troponin and BNP normal.  CXR with chronic mild bibasilar scarring.  CT angio chest showed changes consistent with bilateral lower lobe pulmonary emboli with possible mild right heart strain.  Per EDP note, Dr. Mosetta Putt (hematologist) recommended admission for heparinization and transition to Coumadin.   ROS All review of system negative except for pertinent positives and negatives as history of present illness above.  PMH Past Medical History:  Diagnosis Date   Arthritis    Connective tissue disease (HCC)    GERD (gastroesophageal reflux  disease)    Hypertriglyceridemia    Polymyositis (HCC)    Pulmonary embolus (HCC) 11/10/2022   bilateral   Reactive airway disease    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   PSH Past Surgical History:  Procedure Laterality Date   CARPAL TUNNEL RELEASE Right 10/24/2016   Procedure: CARPAL TUNNEL RELEASE;  Surgeon: Kennedy Bucker, MD;  Location: [facility] ORS;  Service: Orthopedics;  Laterality: Right;   VASECTOMY     Fam HX History reviewed. No pertinent family history.  Denies family history of blood clots.  Social Hx  reports that he has never smoked. He has never used smokeless tobacco. He reports current alcohol use. He reports that he does not use drugs.  Allergy No Known Allergies Home Meds Prior to Admission medications   Medication Sig Start Date End Date Taking? Authorizing Provider  acetaminophen (TYLENOL) 325 MG tablet Take 325-650 mg by mouth every 6 (six) hours as needed for headache or mild pain.   Yes [provider]  albuterol (PROVENTIL) (2.5 MG/3ML) 0.083% nebulizer solution Take 2.5 mg by nebulization every 6 (six) hours as needed for wheezing or shortness of breath.   Yes [provider]  albuterol (VENTOLIN HFA) 108 (90 Base) MCG/ACT inhaler Inhale 2 puffs into the lungs every 4 (four) hours as needed for wheezing or shortness of breath. 12/20/21  Yes Pfeiffer, Lebron Conners, MD  cyanocobalamin (VITAMIN B12) 1000 MCG tablet Take 1,000 mcg by mouth daily.   Yes [provider]  fluticasone-salmeterol (ADVAIR) 500-50  MCG/ACT AEPB Inhale 1 puff into the lungs at bedtime. 01/20/20  Yes [provider]  PRILOSEC OTC 20 MG tablet Take 20 mg by mouth at bedtime.   Yes [provider]  Rivaroxaban (XARELTO) 15 MG TABS tablet Take 1 tablet (15 mg total) by mouth 2 (two) times daily with a meal. 02/05/23  Yes Eber Hong, MD    Physical Exam: Vitals:   02/07/23 0445 02/07/23 0500 02/07/23 0756 02/07/23 0900  BP: (!) 140/98 (!)  152/106  (!) 136/100  Pulse: 69 88  62  Resp: 14 17  18   Temp:   98.5 F (36.9 C)   TempSrc:   Oral   SpO2: 100% 100%  100%    GENERAL: No acute distress.  Appears well.  HEENT: MMM.  Vision and hearing grossly intact.  NECK: Supple.  No apparent JVD.  RESP:  No IWOB. Good air movement bilaterally. CVS:  RRR. Heart sounds normal.  ABD/GI/GU: Bowel sounds present. Soft. Non tender.  MSK/EXT:  Moves extremities.  LLE swelling and tenderness to his left thigh. SKIN: no apparent skin lesion or wound NEURO: Awake, alert and oriented appropriately.  No gross deficit.  PSYCH: Calm. Normal affect.   Personally Reviewed Radiological Exams See HPI   Personally Reviewed Labs: CBC: Recent Labs  Lab 02/06/23 2351 02/07/23 0737  WBC 10.8* 9.7  HGB 14.1 13.2  HCT 39.5 39.6  MCV 91.0 93.0  PLT 295 290   Basic Metabolic Panel: Recent Labs  Lab 02/06/23 2351 02/07/23 0737  NA 136 137  K 3.4* 3.2*  CL 98 97*  CO2 28 28  GLUCOSE 123* 100*  BUN 7 6  CREATININE 0.99 0.94  CALCIUM 9.2 9.1  MG  --  2.0  PHOS  --  4.7*   GFR: Estimated Creatinine Clearance: 92.5 mL/min (by C-G formula based on SCr of 0.94 mg/dL). Liver Function Tests: Recent Labs  Lab 02/07/23 0737  AST 16  ALT 14  ALKPHOS 68  BILITOT 0.7  PROT 7.0  ALBUMIN 3.1*   No results for input(s): "LIPASE", "AMYLASE" in the last 168 hours. No results for input(s): "AMMONIA" in the last 168 hours. Coagulation Profile: No results for input(s): "INR", "PROTIME" in the last 168 hours. Cardiac Enzymes: No results for input(s): "CKTOTAL", "CKMB", "CKMBINDEX", "TROPONINI" in the last 168 hours. BNP (last 3 results) No results for input(s): "PROBNP" in the last 8760 hours. HbA1C: No results for input(s): "HGBA1C" in the last 72 hours. CBG: No results for input(s): "GLUCAP" in the last 168 hours. Lipid Profile: No results for input(s): "CHOL", "HDL", "LDLCALC", "TRIG", "CHOLHDL", "LDLDIRECT" in the last 72  hours. Thyroid Function Tests: No results for input(s): "TSH", "T4TOTAL", "FREET4", "T3FREE", "THYROIDAB" in the last 72 hours. Anemia Panel: No results for input(s): "VITAMINB12", "FOLATE", "FERRITIN", "TIBC", "IRON", "RETICCTPCT" in the last 72 hours. 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  Presents with LLE pain and swelling despite compliance with Xarelto.  LE venous Doppler on 5/8 with age-indeterminate left popliteal DVT.  He was  discharged on starter pack Xarelto but continued to have progressive LLE pain and swelling despite compliance.  CT angio chest with PE in bilateral lower lung field.  Lower extremity venous Doppler with acute DVT in left mid and distal femoral vein and age-indeterminate DVT in left popliteal vein.  BNP and troponin negative.  No oxygen requirement.  Patient was asking about the Lovenox versus warfarin. -Continue IV heparin for now. Dr. Mosetta Putt okay with Lovenox on discharge -Elevate left leg. -Outpatient follow-up with hematology  History of ILD: -Outpatient follow-up.  Hypokalemia: -Monitor replenish as appropriate   DVT prophylaxis: On full dose anticoagulation  Code Status: Full code Family Communication: None at bedside  Consults called: Hematology Admission status: Observation   Michael Hercules MD [facility]  If 7PM-7AM, please contact  night-coverage [email or web address]  02/07/2023, 11:10 AM

## 2023-02-07 NOTE — ED Notes (Signed)
Pt is a&ox4, warm and dry to touch. Pt currently has no complaints. Pt is attached to the monitor/vitals. Currently on a heparin gtt. Side rails up x 2, covered with multiple blankets, call light is within reach.

## 2023-02-07 NOTE — ED Notes (Signed)
Pt provided a sandwich and ice water

## 2023-02-07 NOTE — ED Notes (Signed)
ED TO INPATIENT HANDOFF REPORT  ED Nurse Name and Phone #: Donnella Bi [phone]  S Name/Age/Gender Michael Alexander. 57 y.o. male Room/Bed: 040C/040C  Code Status   Code Status: Prior  Home/SNF/Other Home Patient oriented to: self, place, time, and situation Is this baseline? Yes   Triage Complete: Triage complete  Chief Complaint Acute pulmonary embolism (HCC) [I26.99] Acute pulmonary embolism with acute cor pulmonale (HCC) [I26.09]  Triage Note Pt arrives via POV d/t concerns for blood clots. States left lower leg pain that has progressed into the upper leg since he was evaluated along with concern for PR s/t SOB. Denies CP. No resp distress in triage.   Has US DVT yesterday. + DVT, states compliance with xarelto.    Allergies No Known Allergies  Level of Care/Admitting Diagnosis ED Disposition     ED Disposition  Admit   Condition  --   Comment  Hospital Area: [facility] [[id number]]  Level of Care: Telemetry Medical [104]  May admit patient to [facility] or [facility] if equivalent level of care is available:: No  Covid Evaluation: Asymptomatic - no recent exposure (last 10 days) testing not required  Diagnosis: Acute pulmonary embolism with acute cor pulmonale [facility]) [[id number]]  Admitting Physician: Almon Hercules [[id number]]  Attending Physician: Almon Hercules [id number]  Certification:: I certify this patient will need inpatient services for at least 2 midnights          B Medical/Surgery History Past Medical History:  Diagnosis Date   Arthritis    Connective tissue disease (HCC)    GERD (gastroesophageal reflux disease)    Hypertriglyceridemia    Polymyositis (HCC)    Pulmonary embolus (HCC) 11/10/2022   bilateral   Reactive airway disease    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   Past Surgical History:  Procedure Laterality Date   CARPAL TUNNEL RELEASE Right 10/24/2016   Procedure: CARPAL TUNNEL RELEASE;   Surgeon: Kennedy Bucker, MD;  Location: [facility] ORS;  Service: Orthopedics;  Laterality: Right;   VASECTOMY       A IV Location/Drains/Wounds Patient Lines/Drains/Airways Status     Active Line/Drains/Airways     Name Placement date Placement time Site Days   Peripheral IV 02/07/23 20 G 1" Anterior;Distal;Right;Upper Arm 02/07/23  0250  Arm  less than 1            Intake/Output Last 24 hours No intake or output data in the 24 hours ending 02/07/23 1305  Labs/Imaging Results for orders placed or performed during the hospital encounter of 02/06/23 (from the past 48 hour(s))  Basic metabolic panel     Status: Abnormal   Collection Time: 02/06/23 11:51 PM  Result Value Ref Range   Sodium 136 135 - 145 mmol/L   Potassium 3.4 (L) 3.5 - 5.1 mmol/L   Chloride 98 98 - 111 mmol/L   CO2 28 22 - 32 mmol/L   Glucose, Bld 123 (H) 70 - 99 mg/dL    Comment: Glucose reference range applies only to samples taken after fasting for at least 8 hours.   BUN 7 6 - 20 mg/dL   Creatinine, Ser 9.56 0.61 - 1.24 mg/dL   Calcium 9.2 8.9 - 21.3 mg/dL   GFR, Estimated >08 >65 mL/min    Comment: (NOTE) Calculated using the CKD-EPI Creatinine Equation (2021)    Anion gap 10 5 - 15    Comment: Performed at [facility], [address],  [place], [place] [address]  CBC     Status: Abnormal   Collection Time: 02/06/23 11:51 PM  Result Value Ref Range   WBC 10.8 (H) 4.0 - 10.5 K/uL   RBC 4.34 4.22 - 5.81 MIL/uL   Hemoglobin 14.1 13.0 - 17.0 g/dL   HCT 56.2 13.0 - 86.5 %   MCV 91.0 80.0 - 100.0 fL   MCH 32.5 26.0 - 34.0 pg   MCHC 35.7 30.0 - 36.0 g/dL   RDW 78.4 69.6 - 29.5 %   Platelets 295 150 - 400 K/uL   nRBC 0.0 0.0 - 0.2 %    Comment: Performed at [facility], [address], [place], [place] [address]  Troponin I (High Sensitivity)     Status: None   Collection Time: 02/06/23 11:51 PM  Result Value Ref Range   Troponin I (High Sensitivity) 3 <18 ng/L    Comment: (NOTE) Elevated  high sensitivity troponin I (hsTnI) values and significant  changes across serial measurements may suggest ACS but many other  chronic and acute conditions are known to elevate hsTnI results.  Refer to the "Links" section for chest pain algorithms and additional  guidance. Performed at [facility], [address] [place], [place] [address]   Brain natriuretic peptide     Status: None   Collection Time: 02/07/23  7:37 AM  Result Value Ref Range   B Natriuretic Peptide 9.8 0.0 - 100.0 pg/mL    Comment: Performed at [facility], [address] [place], [place] [address]  Troponin I (High Sensitivity)     Status: None   Collection Time: 02/07/23  7:37 AM  Result Value Ref Range   Troponin I (High Sensitivity) 3 <18 ng/L    Comment: (NOTE) Elevated high sensitivity troponin I (hsTnI) values and significant  changes across serial measurements may suggest ACS but many other  chronic and acute conditions are known to elevate hsTnI results.  Refer to the "Links" section for chest pain algorithms and additional  guidance. Performed at [facility], [address], [place], [place] [address]   CBC     Status: None   Collection Time: 02/07/23  7:37 AM  Result Value Ref Range   WBC 9.7 4.0 - 10.5 K/uL   RBC 4.26 4.22 - 5.81 MIL/uL   Hemoglobin 13.2 13.0 - 17.0 g/dL   HCT 03.4 74.2 - 59.5 %   MCV 93.0 80.0 - 100.0 fL   MCH 31.0 26.0 - 34.0 pg   MCHC 33.3 30.0 - 36.0 g/dL   RDW 63.8 75.6 - 43.3 %   Platelets 290 150 - 400 K/uL   nRBC 0.0 0.0 - 0.2 %    Comment: Performed at [facility], [address] [place], [place] [address]  Comprehensive metabolic panel     Status: Abnormal   Collection Time: 02/07/23  7:37 AM  Result Value Ref Range   Sodium 137 135 - 145 mmol/L   Potassium 3.2 (L) 3.5 - 5.1 mmol/L   Chloride 97 (L) 98 - 111 mmol/L   CO2 28 22 - 32 mmol/L   Glucose, Bld 100 (H) 70 - 99 mg/dL    Comment: Glucose reference range applies only to  samples taken after fasting for at least 8 hours.   BUN 6 6 - 20 mg/dL   Creatinine, Ser 8.41 0.61 - 1.24 mg/dL   Calcium 9.1 8.9 - 66.0 mg/dL   Total Protein 7.0  6.5 - 8.1 g/dL   Albumin 3.1 (L) 3.5 - 5.0 g/dL   AST 16 15 - 41 U/L   ALT 14 0 - 44 U/L   Alkaline Phosphatase 68 38 - 126 U/L   Total Bilirubin 0.7 0.3 - 1.2 mg/dL   GFR, Estimated >40 >98 mL/min    Comment: (NOTE) Calculated using the CKD-EPI Creatinine Equation (2021)    Anion gap 12 5 - 15    Comment: Performed at [facility], [address] [place], [place] [address]  Magnesium     Status: None   Collection Time: 02/07/23  7:37 AM  Result Value Ref Range   Magnesium 2.0 1.7 - 2.4 mg/dL    Comment: Performed at [facility], [address] [place], [place] [address]  Phosphorus     Status: Abnormal   Collection Time: 02/07/23  7:37 AM  Result Value Ref Range   Phosphorus 4.7 (H) 2.5 - 4.6 mg/dL    Comment: Performed at [facility], [address] [place], [place] [address]  Lactic acid, plasma     Status: Abnormal   Collection Time: 02/07/23  7:40 AM  Result Value Ref Range   Lactic Acid, Venous 0.3 (L) 0.5 - 1.9 mmol/L    Comment: Performed at [facility], [address], [place], [place] [address]   VAS [place] LOWER EXTREMITY VENOUS (DVT)  Result Date: 02/07/2023  Lower Venous DVT Study Patient Name:  Seeley Letter.  Date of Exam:   02/07/2023 Medical Rec #: [id number]           Accession #:    [id number] Date of Birth: [birth year]           Patient Gender: M Patient Age:   68 years Exam Location:  [facility] Procedure:      VAS [place] LOWER EXTREMITY VENOUS (DVT) Referring Phys: Cristal Deer POLLINA --------------------------------------------------------------------------------  Indications: Increasing pain in LLE, SOB, Hx of DVT in LLE., and pulmonary embolism.  Anticoagulation: Heparin. Comparison Study: Previous LLE 02/05/23 - age indeterminate DVT in popliteal                    vein. Performing Technologist: McKayla Maag RVT, [place]  Examination Guidelines: A complete evaluation includes B-mode imaging, spectral Doppler, color Doppler, and power Doppler as needed of all accessible portions of each vessel. Bilateral testing is considered an integral part of a complete examination. Limited examinations for reoccurring indications may be performed as noted. The reflux portion of the exam is performed with the patient in reverse Trendelenburg.  +-----+---------------+---------+-----------+----------+--------------+ RIGHTCompressibilityPhasicitySpontaneityPropertiesThrombus Aging +-----+---------------+---------+-----------+----------+--------------+ CFV  Full           Yes      Yes                                 +-----+---------------+---------+-----------+----------+--------------+ SFJ  Full                                                        +-----+---------------+---------+-----------+----------+--------------+   +---------+---------------+---------+-----------+----------+-----------------+ LEFT     CompressibilityPhasicitySpontaneityPropertiesThrombus Aging    +---------+---------------+---------+-----------+----------+-----------------+ CFV      Full           Yes  Yes                                    +---------+---------------+---------+-----------+----------+-----------------+ SFJ      Full                                                           +---------+---------------+---------+-----------+----------+-----------------+ FV Prox  Full                                                           +---------+---------------+---------+-----------+----------+-----------------+ FV Mid   Partial        Yes      Yes                  Acute             +---------+---------------+---------+-----------+----------+-----------------+ FV DistalPartial        Yes      Yes                  Acute              +---------+---------------+---------+-----------+----------+-----------------+ PFV      Full                                                           +---------+---------------+---------+-----------+----------+-----------------+ POP      Partial        Yes      Yes                  Age Indeterminate +---------+---------------+---------+-----------+----------+-----------------+ PTV      Full                                                           +---------+---------------+---------+-----------+----------+-----------------+ PERO     Full                                                           +---------+---------------+---------+-----------+----------+-----------------+    Summary: RIGHT: - No evidence of common femoral vein obstruction.  LEFT: - Findings consistent with acute deep vein thrombosis involving the left mid and distal femoral veins. - Findings consistent with age indeterminate deep vein thrombosis involving the left popliteal vein. - No cystic structure found in the popliteal fossa.  *See table(s) above for measurements and observations.    Preliminary    CT Angio Chest PE W and/or Wo Contrast  Result Date: 02/07/2023 CLINICAL DATA:  Lower leg pain with shortness of breath, initial encounter EXAM: CT ANGIOGRAPHY CHEST WITH  CONTRAST TECHNIQUE: Multidetector CT imaging of the chest was performed using the standard protocol during bolus administration of intravenous contrast. Multiplanar CT image reconstructions and MIPs were obtained to evaluate the vascular anatomy. RADIATION DOSE REDUCTION: This exam was performed according to the departmental dose-optimization program which includes automated exposure control, adjustment of the mA and/or kV according to patient size and/or use of iterative reconstruction technique. CONTRAST:  75mL OMNIPAQUE IOHEXOL 350 MG/ML SOLN COMPARISON:  Chest x-ray from earlier in the same day, CT from 11/10/2022 FINDINGS: Cardiovascular:  Thoracic aorta shows no aneurysmal dilatation or dissection. No cardiac enlargement is seen. Scattered coronary calcifications are noted. Pulmonary artery shows a normal branching pattern. Scattered filling defects are identified within the pulmonary artery bilateral. The overall appearance is similar to that seen on the prior study predominately within the lower lobes. Changes of right heart strain are noted with elevation of the RV/LV ratio to 1.1. Mediastinum/Nodes: No sizable hilar or mediastinal adenopathy is noted. The esophagus as visualized is within normal limits. Thoracic inlet is unremarkable. Persistent soft tissue density is noted in the anterior mediastinum which likely represents residual thymus. No mass effect is seen. Lungs/Pleura: Lungs demonstrates significant fibrotic disease predominately in the bases with honeycombing identified. No sizable parenchymal nodule is noted. No effusion is seen. Upper Abdomen: No acute abnormality. Musculoskeletal: No chest wall abnormality. No acute or significant osseous findings. Review of the MIP images confirms the above findings. IMPRESSION: Changes consistent with bilateral lower lobe pulmonary emboli. The overall appearance is similar to that seen on the prior exam. Findings of mild right heart strain are noted. Critical Value/emergent results were called by telephone at the time of interpretation on 02/07/2023 at 3:56 am to Dr. Jaci Carrel , who verbally acknowledged these results. Electronically Signed   By: Alcide Clever M.D.   On: 02/07/2023 03:58   DG Chest 2 View  Result Date: 02/07/2023 CLINICAL DATA:  Shortness of breath EXAM: CHEST - 2 VIEW COMPARISON:  None Available. FINDINGS: Cardiac shadow is within normal limits. Lungs are well aerated bilaterally. Mild bibasilar scarring is seen stable from the prior exam. No bony abnormality is noted. IMPRESSION: Chronic changes without acute abnormality. Electronically Signed   By: Alcide Clever M.D.    On: 02/07/2023 01:16    Pending Labs Unresulted Labs (From admission, onward)     Start     Ordered   02/08/23 0500  Heparin level (unfractionated)  Daily,   R      02/07/23 0519   02/08/23 0500  CBC  Daily,   R      02/07/23 0519   02/08/23 0500  Renal function panel  Tomorrow morning,   R        02/07/23 1116   02/08/23 0500  Magnesium  Tomorrow morning,   R        02/07/23 1116   02/07/23 1300  Heparin level (unfractionated)  Once-Timed,   URGENT        02/07/23 0519   02/07/23 1300  APTT  Once-Timed,   STAT        02/07/23 0522   02/07/23 1300  Protime-INR  Once-Timed,   STAT        02/07/23 0522            Vitals/Pain Today's Vitals   02/07/23 1100 02/07/23 1200 02/07/23 1215 02/07/23 1230  BP: (!) 152/97 (!) 152/97  (!) 148/98  Pulse: 67 74 61 (!) 58  Resp: 11 17 20  18  Temp:      TempSrc:      SpO2: 100% 100% 100% 100%  PainSc:        Isolation Precautions No active isolations  Medications Medications  heparin ADULT infusion 100 units/mL (25000 units/269mL) (1,300 Units/hr Intravenous New Bag/Given 02/07/23 0600)  potassium chloride SA (KLOR-CON M) CR tablet 40 mEq (40 mEq Oral Given 02/07/23 1045)  morphine (PF) 4 MG/ML injection 4 mg (4 mg Intravenous Given 02/07/23 0256)  ondansetron (ZOFRAN) injection 4 mg (4 mg Intravenous Given 02/07/23 0255)  iohexol (OMNIPAQUE) 350 MG/ML injection 75 mL (75 mLs Intravenous Contrast Given 02/07/23 0338)  potassium chloride SA (KLOR-CON M) CR tablet 40 mEq (40 mEq Oral Given 02/07/23 0757)    Mobility walks     Focused Assessments Cardiac Assessment Handoff:  Cardiac Rhythm: Normal sinus rhythm No results found for: "CKTOTAL", "CKMB", "CKMBINDEX", "TROPONINI" No results found for: "DDIMER" Does the Patient currently have chest pain? No    R Recommendations: See Admitting Provider Note  Report given to:   Additional Notes: .

## 2023-02-07 NOTE — ED Notes (Signed)
Patient transported to CT 

## 2023-02-07 NOTE — Progress Notes (Addendum)
Left lower extremity venous study completed.   Preliminary results relayed to RN.  Please see CV Procedures for preliminary results.  Savilla Turbyfill, RVT  9:11 AM 02/07/23

## 2023-02-07 NOTE — ED Provider Notes (Signed)
[place] EMERGENCY DEPARTMENT AT [facility] Provider Note   CSN: [id number] Arrival date & time: 02/06/23  2330     History  Chief Complaint  Patient presents with   Leg Pain   Shortness of Breath    Michael Alexander. is a 57 y.o. male.  Patient presents to the emergency department with increasing leg pain as well as chest pain and shortness of breath.  Patient seen 1 day ago with leg pain and diagnosed with DVT.  Patient already on Xarelto, dose was increased after consultation with hematology.  Patient has not followed up with his hematologist at [facility] yet.  Patient concerned because the pain is somewhat worse today and now not only behind the knee but also in the thigh.  Patient complaining of shortness of breath.  He feels like his breathing is labored.  He has had some cough that is nonproductive.  Thought it might be allergies but wants to make sure it is not extension of the blood clot.       Home Medications Prior to Admission medications   Medication Sig Start Date End Date Taking? Authorizing Provider  acetaminophen (TYLENOL) 325 MG tablet Take 325-650 mg by mouth every 6 (six) hours as needed for headache or mild pain.    [provider]  albuterol (PROVENTIL) (2.5 MG/3ML) 0.083% nebulizer solution Take 2.5 mg by nebulization every 6 (six) hours as needed for wheezing or shortness of breath.    [provider]  albuterol (VENTOLIN HFA) 108 (90 Base) MCG/ACT inhaler Inhale 2 puffs into the lungs every 4 (four) hours as needed for wheezing or shortness of breath. 12/20/21   Arby Barrette, MD  Aspirin-Salicylamide-Caffeine (BC HEADACHE POWDER PO) Take 1 packet by mouth 2 (two) times daily as needed (for headaches or pain).    [provider]  fluticasone-salmeterol (ADVAIR) 500-50 MCG/ACT AEPB Inhale 1 puff into the lungs at bedtime. 01/20/20   [provider]  PRILOSEC OTC 20 MG tablet Take 20 mg by mouth at bedtime.     [provider]  Rivaroxaban (XARELTO) 15 MG TABS tablet Take 1 tablet (15 mg total) by mouth 2 (two) times daily with a meal. 02/05/23   Eber Hong, MD      Allergies    Patient has no known allergies.    Review of Systems   Review of Systems  Physical Exam Updated Vital Signs BP (!) 140/98   Pulse 69   Temp 97.6 F (36.4 C)   Resp 14   SpO2 100%  Physical Exam Vitals and nursing note reviewed.  Constitutional:      General: He is not in acute distress.    Appearance: He is well-developed.  HENT:     Head: Normocephalic and atraumatic.     Mouth/Throat:     Mouth: Mucous membranes are moist.  Eyes:     General: Vision grossly intact. Gaze aligned appropriately.     Extraocular Movements: Extraocular movements intact.     Conjunctiva/sclera: Conjunctivae normal.  Cardiovascular:     Rate and Rhythm: Normal rate and regular rhythm.     Pulses: Normal pulses.     Heart sounds: Normal heart sounds, S1 normal and S2 normal. No murmur heard.    No friction rub. No gallop.  Pulmonary:     Effort: Pulmonary effort is normal. No respiratory distress.     Breath sounds: Normal breath sounds.  Abdominal:     Palpations: Abdomen is soft.  Tenderness: There is no abdominal tenderness. There is no guarding or rebound.     Hernia: No hernia is present.  Musculoskeletal:        General: No swelling.     Cervical back: Full passive range of motion without pain, normal range of motion and neck supple. No pain with movement, spinous process tenderness or muscular tenderness. Normal range of motion.     Left upper leg: Tenderness present. No swelling.     Right lower leg: No edema.     Left lower leg: No edema.  Skin:    General: Skin is warm and dry.     Capillary Refill: Capillary refill takes less than 2 seconds.     Findings: No ecchymosis, erythema, lesion or wound.  Neurological:     Mental Status: He is alert and oriented to person, place, and time.     GCS:  GCS eye subscore is 4. GCS verbal subscore is 5. GCS motor subscore is 6.     Cranial Nerves: Cranial nerves 2-12 are intact.     Sensory: Sensation is intact.     Motor: Motor function is intact. No weakness or abnormal muscle tone.     Coordination: Coordination is intact.  Psychiatric:        Mood and Affect: Mood normal.        Speech: Speech normal.        Behavior: Behavior normal.     ED Results / Procedures / Treatments   Labs (all labs ordered are listed, but only abnormal results are displayed) Labs Reviewed  BASIC METABOLIC PANEL - Abnormal; Notable for the following components:      Result Value   Potassium 3.4 (*)    Glucose, Bld 123 (*)    All other components within normal limits  CBC - Abnormal; Notable for the following components:   WBC 10.8 (*)    All other components within normal limits  TROPONIN I (HIGH SENSITIVITY)  TROPONIN I (HIGH SENSITIVITY)    EKG EKG Interpretation  Date/Time:  Thursday Feb 06 2023 23:56:10 EDT Ventricular Rate:  90 PR Interval:  134 QRS Duration: 84 QT Interval:  364 QTC Calculation: 445 R Axis:   -52 Text Interpretation: Normal sinus rhythm Left axis deviation Inferior infarct , age undetermined Anterior infarct , age undetermined Abnormal ECG When compared with ECG of 10-Nov-2022 07:37, No significant change was found Confirmed by Gilda Crease [phone]) on 02/07/2023 2:25:40 AM  Radiology CT Angio Chest PE W and/or Wo Contrast  Result Date: 02/07/2023 CLINICAL DATA:  Lower leg pain with shortness of breath, initial encounter EXAM: CT ANGIOGRAPHY CHEST WITH CONTRAST TECHNIQUE: Multidetector CT imaging of the chest was performed using the standard protocol during bolus administration of intravenous contrast. Multiplanar CT image reconstructions and MIPs were obtained to evaluate the vascular anatomy. RADIATION DOSE REDUCTION: This exam was performed according to the departmental dose-optimization program which includes  automated exposure control, adjustment of the mA and/or kV according to patient size and/or use of iterative reconstruction technique. CONTRAST:  75mL OMNIPAQUE IOHEXOL 350 MG/ML SOLN COMPARISON:  Chest x-ray from earlier in the same day, CT from 11/10/2022 FINDINGS: Cardiovascular: Thoracic aorta shows no aneurysmal dilatation or dissection. No cardiac enlargement is seen. Scattered coronary calcifications are noted. Pulmonary artery shows a normal branching pattern. Scattered filling defects are identified within the pulmonary artery bilateral. The overall appearance is similar to that seen on the prior study predominately within the lower lobes. Changes of  right heart strain are noted with elevation of the RV/LV ratio to 1.1. Mediastinum/Nodes: No sizable hilar or mediastinal adenopathy is noted. The esophagus as visualized is within normal limits. Thoracic inlet is unremarkable. Persistent soft tissue density is noted in the anterior mediastinum which likely represents residual thymus. No mass effect is seen. Lungs/Pleura: Lungs demonstrates significant fibrotic disease predominately in the bases with honeycombing identified. No sizable parenchymal nodule is noted. No effusion is seen. Upper Abdomen: No acute abnormality. Musculoskeletal: No chest wall abnormality. No acute or significant osseous findings. Review of the MIP images confirms the above findings. IMPRESSION: Changes consistent with bilateral lower lobe pulmonary emboli. The overall appearance is similar to that seen on the prior exam. Findings of mild right heart strain are noted. Critical Value/emergent results were called by telephone at the time of interpretation on 02/07/2023 at 3:56 am to Dr. Jaci Carrel , who verbally acknowledged these results. Electronically Signed   By: Alcide Clever M.D.   On: 02/07/2023 03:58   DG Chest 2 View  Result Date: 02/07/2023 CLINICAL DATA:  Shortness of breath EXAM: CHEST - 2 VIEW COMPARISON:  None  Available. FINDINGS: Cardiac shadow is within normal limits. Lungs are well aerated bilaterally. Mild bibasilar scarring is seen stable from the prior exam. No bony abnormality is noted. IMPRESSION: Chronic changes without acute abnormality. Electronically Signed   By: Alcide Clever M.D.   On: 02/07/2023 01:16   VAS [place] LOWER EXTREMITY VENOUS (DVT)  Result Date: 02/05/2023  Lower Venous DVT Study Patient Name:  Michael Alexander.  Date of Exam:   02/05/2023 Medical Rec #: [id number]           Accession #:    [id number] Date of Birth: [birth year]           Patient Gender: M Patient Age:   41 years Exam Location:  [facility] Procedure:      VAS [place] LOWER EXTREMITY VENOUS (DVT) Referring Phys: Maxwell Marion --------------------------------------------------------------------------------  Indications: Left knee pain x 2 days, history of PE. Other Indications: History of PE - most recent CTA on 11-10-2022. Performing Technologist: Jean Rosenthal RDMS, RVT  Examination Guidelines: A complete evaluation includes B-mode imaging, spectral Doppler, color Doppler, and power Doppler as needed of all accessible portions of each vessel. Bilateral testing is considered an integral part of a complete examination. Limited examinations for reoccurring indications may be performed as noted. The reflux portion of the exam is performed with the patient in reverse Trendelenburg.  +-----+---------------+---------+-----------+----------+--------------+ RIGHTCompressibilityPhasicitySpontaneityPropertiesThrombus Aging +-----+---------------+---------+-----------+----------+--------------+ CFV  Full           Yes      Yes                                 +-----+---------------+---------+-----------+----------+--------------+   +---------+---------------+---------+-----------+----------+-----------------+ LEFT     CompressibilityPhasicitySpontaneityPropertiesThrombus Aging     +---------+---------------+---------+-----------+----------+-----------------+ CFV      Full           Yes      Yes                                    +---------+---------------+---------+-----------+----------+-----------------+ SFJ      Full                                                           +---------+---------------+---------+-----------+----------+-----------------+  FV Prox  Full                                                           +---------+---------------+---------+-----------+----------+-----------------+ FV Mid   Full                                                           +---------+---------------+---------+-----------+----------+-----------------+ FV DistalFull                                                           +---------+---------------+---------+-----------+----------+-----------------+ PFV      Full                                                           +---------+---------------+---------+-----------+----------+-----------------+ POP      Partial        Yes      Yes                  Age Indeterminate +---------+---------------+---------+-----------+----------+-----------------+ PTV      Full                                                           +---------+---------------+---------+-----------+----------+-----------------+ PERO     Full                                                           +---------+---------------+---------+-----------+----------+-----------------+ Gastroc  Full                               [place]                    +---------+---------------+---------+-----------+----------+-----------------+     Summary: RIGHT: - No evidence of common femoral vein obstruction.  LEFT: - Findings consistent with age indeterminate deep vein thrombosis involving the left popliteal vein. - No cystic structure found in the popliteal fossa.  *See table(s) above for measurements and observations.  Electronically signed by Sherald Hess MD on 02/05/2023 at 10:58:41 AM.    Final     Procedures Procedures    Medications Ordered in ED Medications  morphine (PF) 4 MG/ML injection 4 mg (4 mg Intravenous Given 02/07/23 0256)  ondansetron (ZOFRAN) injection 4 mg (4 mg Intravenous Given 02/07/23 0255)  iohexol (OMNIPAQUE) 350 MG/ML injection 75 mL (75 mLs Intravenous Contrast Given 02/07/23 0338)  ED Course/ Medical Decision Making/ A&P                             Medical Decision Making Amount and/or Complexity of Data Reviewed Labs: ordered. Radiology: ordered.  Risk Prescription drug management.   Patient presents to the emergency department for evaluation of leg pain and shortness of breath.  Differential diagnosis considered includes, but not limited to: Extension of DVT; ACS; PE  Patient with known DVT returns to the emergency department for increasing leg pain.  In addition, patient now experiencing shortness of breath.  Patient has complex medical history.  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  He was brought into the hospital at that time and restarted on his Xarelto after IV heparinization.  It looks like hypercoagulable studies at that time were negative.  Patient returned to the ED on May 8 with leg pain.  This occurred after a plane flight and cruise.  Venous duplex revealed DVT.  After discussion with hematology at that time, patient was given option of increasing dose of Xarelto versus starting Coumadin.  Patient opted for increased dose of Xarelto, 15 mg twice daily.  Been compliant with his Xarelto regimen.  CT angio today shows bilateral PE.  Cannot completely rule out these being the old PE seen in February, but I would have anticipated some resolution by now.  Must consider the possibility of recurrent PE on Xarelto.  Discussed with Dr. Mosetta Putt, on-call for  heme-onc.  She recommends admission, heparinization and transition to Coumadin.  Patient does not dyspneic or hypoxic, would not anticipate any need for directed therapy for PE.  Discussed briefly with Dr. Marcos Eke, pulmonary critical care.  Patient will be consulted by day team.  With his increasing leg pain, however, will need to repeat venous study and ensure that there is not progression.  No evidence of phlegmasia alba or phlegmasia cerulea dolens.  He has good arterial blood flow.  Doubt that he will need vascular intervention but will repeat study.  CRITICAL CARE Performed by: Gilda Crease   Total critical care time: 35 minutes  Critical care time was exclusive of separately billable procedures and treating other patients.  Critical care was necessary to treat or prevent imminent or life-threatening deterioration.  Critical care was time spent personally by me on the following activities: development of treatment plan with patient and/or surrogate as well as nursing, discussions with consultants, evaluation of patient's response to treatment, examination of patient, obtaining history from patient or surrogate, ordering and performing treatments and interventions, ordering and review of laboratory studies, ordering and review of radiographic studies, pulse oximetry and re-evaluation of patient's condition.         Final Clinical Impression(s) / ED Diagnoses Final diagnoses:  Other pulmonary embolism without acute cor pulmonale, unspecified chronicity (HCC)    Rx / DC Orders ED Discharge Orders     None         Lanyah Spengler, Canary Brim, MD 02/07/23 [phone]

## 2023-02-07 NOTE — Progress Notes (Addendum)
ANTICOAGULATION CONSULT NOTE - Initial Consult  Pharmacy Consult for heparin Indication: pulmonary embolus and DVT  No Known Allergies  Patient Measurements:   Heparin Dosing Weight: 86Kg  Vital Signs: Temp: 97.6 F (36.4 C) (05/09 2341) BP: 140/98 (05/10 0445) Pulse Rate: 69 (05/10 0445)  Labs: Recent Labs    02/06/23 2351  HGB 14.1  HCT 39.5  PLT 295  CREATININE 0.99  TROPONINIHS 3    Estimated Creatinine Clearance: 87.8 mL/min (by C-G formula based on SCr of 0.99 mg/dL).   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 male presenting to ED with increasing leg pain as well as chest pain and shortness of breath. Patient with history of prior PE in Feb 2024 on rivaroxaban prior to admission (Last dose 02/06/23 @2200 ). Dose recently increase to 15mg  BID following hematology consultation. Per discussion with EDP this is a true rivaroxaban failure and the plan will be to eventually transition to warfarin with a heparin bridge. The EDP would like to start heparin therapy immediately given new PE/DVT while taking rivaroxaban. Pharmacy to dose heparin. Per EDP request, no initial bolus. CBC WNL  Goal of Therapy:  Heparin level 0.3-0.7 units/ml aPTT 66-102 units/ml Monitor platelets by anticoagulation protocol: Yes   Plan:  Start heparin infusion at 1300 units/hr Check anti-Xa level/aPTT in 8 hours and daily while on heparin Continue to monitor H&H and platelets F/U plan to transition to alternate oral therapy  Ruben Im, PharmD Clinical Pharmacist 02/07/2023 5:14 AM Please check AMION for all [facility] Pharmacy numbers

## 2023-02-08 DIAGNOSIS — J849 Interstitial pulmonary disease, unspecified: Secondary | ICD-10-CM

## 2023-02-08 DIAGNOSIS — K219 Gastro-esophageal reflux disease without esophagitis: Secondary | ICD-10-CM | POA: Diagnosis not present

## 2023-02-08 DIAGNOSIS — I824Y2 Acute embolism and thrombosis of unspecified deep veins of left proximal lower extremity: Secondary | ICD-10-CM

## 2023-02-08 DIAGNOSIS — I2609 Other pulmonary embolism with acute cor pulmonale: Secondary | ICD-10-CM | POA: Diagnosis not present

## 2023-02-08 LAB — RENAL FUNCTION PANEL
Albumin: 2.8 g/dL — ABNORMAL LOW (ref 3.5–5.0)
Anion gap: 10 (ref 5–15)
BUN: 9 mg/dL (ref 6–20)
CO2: 26 mmol/L (ref 22–32)
Calcium: 9 mg/dL (ref 8.9–10.3)
Chloride: 100 mmol/L (ref 98–111)
Creatinine, Ser: 0.98 mg/dL (ref 0.61–1.24)
GFR, Estimated: >60 mL/min (ref >60)
Glucose, Bld: 99 mg/dL (ref 70–99)
Phosphorus: 3.9 mg/dL (ref 2.5–4.6)
Potassium: 3.8 mmol/L (ref 3.5–5.1)
Sodium: 136 mmol/L (ref 135–145)

## 2023-02-08 LAB — CBC
HCT: 38.9 % — ABNORMAL LOW (ref 39.0–52.0)
Hemoglobin: 13.1 g/dL (ref 13.0–17.0)
MCH: 31.4 pg (ref 26.0–34.0)
MCHC: 33.7 g/dL (ref 30.0–36.0)
MCV: 93.3 fL (ref 80.0–100.0)
Platelets: 301 10*3/uL (ref 150–400)
RBC: 4.17 MIL/uL — ABNORMAL LOW (ref 4.22–5.81)
RDW: 12.8 % (ref 11.5–15.5)
WBC: 9.7 10*3/uL (ref 4.0–10.5)
nRBC: 0 % (ref 0.0–0.2)

## 2023-02-08 LAB — MAGNESIUM: Magnesium: 2 mg/dL (ref 1.7–2.4)

## 2023-02-08 LAB — APTT: aPTT: 86 seconds — ABNORMAL HIGH (ref 24–36)

## 2023-02-08 LAB — HEPARIN LEVEL (UNFRACTIONATED): Heparin Unfractionated: 0.87 IU/mL — ABNORMAL HIGH (ref 0.30–0.70)

## 2023-02-08 NOTE — Progress Notes (Signed)
PROGRESS NOTE  [name]. 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  Started on IV heparin.  Hematology, Dr. Mosetta Putt recommended IV heparin in house and discharged later on Lovenox of warfarin, and outpatient follow-up.  Patient prefers Lovenox on discharge.    Subjective: Seen and examined earlier this morning.  No major events overnight of this morning.  Continues to endorse pain, mainly with ambulation but improved.  Denies chest pain or shortness of breath.  Objective: Vitals:   02/07/23 1454 02/07/23 2020 02/08/23 0450 02/08/23 0819  BP: (!) 155/107 (!) 150/105 (!) 136/93 (!) 144/98  Pulse: 73 83 78 66  Resp: 18 19 19 18   Temp: 97.8 F (36.6 C) 98.5 F (36.9 C) 98.1 F (36.7 C) 98.3 F (36.8 C)  TempSrc: Axillary Oral Oral Oral  SpO2: 100% 100% 99% 100%    Examination:  GENERAL: No apparent distress.  Nontoxic. HEENT: MMM.  Vision and hearing grossly intact.  NECK: Supple.  No apparent JVD.  RESP:  No IWOB.  Fair aeration bilaterally. CVS:  RRR. Heart sounds normal.  ABD/GI/GU: BS+. Abd soft, NTND.  MSK/EXT:  Moves extremities.  Ambulating in the room.  LLE swelling. SKIN: no apparent skin lesion or wound NEURO: Awake, alert and oriented appropriately.  No apparent focal neuro deficit. PSYCH: Calm. Normal affect.   Procedures:  None  Microbiology summarized: None  Assessment and plan: Principal Problem:   Acute pulmonary embolism (HCC) Active Problems:   Interstitial lung disease (HCC)   GERD (gastroesophageal reflux disease)   Acute deep vein  thrombosis (DVT) of proximal end of left lower extremity (HCC)   Acute pulmonary embolism with acute cor pulmonale (HCC)  Recurrent PE/acute LLE DVT: Failed Xarelto outpatient.  CT angio chest with PE in bilateral lower lung field.  Lower extremity venous Doppler with acute DVT in left mid and distal femoral vein and age-indeterminate DVT in left popliteal vein.  BNP and troponin negative.  No oxygen requirement. -Continue IV heparin  -Discharged on Lovenox or warfarin with Lovenox bridge per hematology, Dr. Mosetta Putt.  Patient prefers Lovenox. -Tylenol, Toradol and IV Dilaudid for pain control  History of ILD: -Outpatient follow-up with pulmonology.   Hypokalemia: -Monitor replenish as appropriate  There is no height or weight on file to calculate BMI.         DVT prophylaxis:  On full dose anticoagulation  Code Status: Full code Family Communication: None at bedside Level of care: Med-Surg Status is: Inpatient Remains inpatient appropriate because: Acute PE/DVT   Final disposition:  Consultants:  Hematology, Dr. Mosetta Putt over the phone  35 minutes with more than 50% spent in reviewing records, counseling patient/family and coordinating care.   Sch Meds:  Scheduled Meds: Continuous Infusions:  heparin 1,450 Units/hr (02/08/23 0320)   PRN Meds:.acetaminophen, HYDROmorphone (DILAUDID) injection, ketorolac  Antimicrobials: Anti-infectives (From admission, onward)    None        I have personally reviewed the following labs and images: CBC: Recent Labs  Lab 02/06/23 2351 02/07/23 0737 02/08/23 0404  WBC 10.8* 9.7 9.7  HGB 14.1 13.2 13.1  HCT 39.5 39.6 38.9*  MCV 91.0 93.0 93.3  PLT 295 290 301   BMP &GFR Recent Labs  Lab 02/06/23 2351 02/07/23 0737 02/08/23 0404  NA 136 137 136  K 3.4* 3.2* 3.8  CL 98 97* 100  CO2 28 28 26   GLUCOSE 123* 100* 99  BUN 7 6 9   CREATININE 0.99 0.94 0.98  CALCIUM 9.2 9.1 9.0  MG  --  2.0 2.0  PHOS  --  4.7* 3.9    Estimated Creatinine Clearance: 88.7 mL/min (by C-G formula based on SCr of 0.98 mg/dL). Liver & Pancreas: Recent Labs  Lab 02/07/23 0737 02/08/23 0404  AST 16  --   ALT 14  --   ALKPHOS 68  --   BILITOT 0.7  --   PROT 7.0  --   ALBUMIN 3.1* 2.8*   No results for input(s): "LIPASE", "AMYLASE" in the last 168 hours. No results for input(s): "AMMONIA" in the last 168 hours. Diabetic: No results for input(s): "HGBA1C" in the last 72 hours. No results for input(s): "GLUCAP" in the last 168 hours. Cardiac Enzymes: No results for input(s): "CKTOTAL", "CKMB", "CKMBINDEX", "TROPONINI" in the last 168 hours. No results for input(s): "PROBNP" in the last 8760 hours. Coagulation Profile: Recent Labs  Lab 02/07/23 1533  INR 1.4*   Thyroid Function Tests: No results for input(s): "TSH", "T4TOTAL", "FREET4", "T3FREE", "THYROIDAB" in the last 72 hours. Lipid Profile: No results for input(s): "CHOL", "HDL", "LDLCALC", "TRIG", "CHOLHDL", "LDLDIRECT" in the last 72 hours. Anemia Panel: No results for input(s): "VITAMINB12", "FOLATE", "FERRITIN", "TIBC", "IRON", "RETICCTPCT" in the last 72 hours. Urine analysis: No results found for: "COLORURINE", "APPEARANCEUR", "LABSPEC", "PHURINE", "GLUCOSEU", "HGBUR", "BILIRUBINUR", "KETONESUR", "PROTEINUR", "UROBILINOGEN", "NITRITE", "LEUKOCYTESUR" Sepsis Labs: Invalid input(s): "PROCALCITONIN", "LACTICIDVEN"  Microbiology: No results found for this or any previous visit (from the past 240 hour(s)).  Radiology Studies: No results found.    Ryleigh Buenger T. Jamiah Recore [facility]  If 7PM-7AM, please contact night-coverage [email or web address] 02/08/2023, 3:10 PM

## 2023-02-08 NOTE — Progress Notes (Signed)
ANTICOAGULATION CONSULT NOTE Pharmacy Consult for heparin Indication: pulmonary embolus and DVT  No Known Allergies  Patient Measurements:   Heparin Dosing Weight: 86Kg  Vital Signs: Temp: 98.3 F (36.8 C) (05/11 0819) Temp Source: Oral (05/11 0819) BP: 144/98 (05/11 0819) Pulse Rate: 66 (05/11 0819)  Labs: Recent Labs    02/06/23 2351 02/07/23 0737 02/07/23 1533 02/08/23 0404  HGB 14.1 13.2  --  13.1  HCT 39.5 39.6  --  38.9*  PLT 295 290  --  301  APTT  --   --  65* 86*  LABPROT  --   --  17.5*  --   INR  --   --  1.4*  --   HEPARINUNFRC  --   --  >1.10* 0.87*  CREATININE 0.99 0.94  --  0.98  TROPONINIHS 3 3  --   --      Estimated Creatinine Clearance: 88.7 mL/min (by C-G formula based on SCr of 0.98 mg/dL).   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 male presenting to ED with increasing leg pain as well as chest pain and shortness of breath. Patient with history of prior PE in Feb 2024 on rivaroxaban prior to admission (Last dose 02/06/23 @2200 ). Dose recently increase to 15mg  BID following hematology consultation. Per discussion with EDP this is a true rivaroxaban failure and the plan will be to eventually transition to warfarin with a heparin bridge. The EDP would like to start heparin therapy immediately given new PE/DVT while taking rivaroxaban. Pharmacy to dose heparin. Per EDP request, no initial bolus.   5/11:  aPTT 86 seconds, therapeutic. Heparin level remains falsely elevated at 0.87 but appears to be trending down. CBC stable. No concerns reported from RN - aware to message pharmacy with concerns or issues. Will recheck a confirmatory level in 6  hours. Consider discontinuing aPTT levels once at next draw if correlating.   Goal of Therapy:  Heparin level 0.3-0.7 units/ml aPTT 66-102 units/ml Monitor platelets by anticoagulation protocol: Yes   Plan:  Continue heparin at 1450 units / hr Monitor for signs and symptoms of bleeding  Measure daily heparin level and aPTT until correlating   Blane Ohara, PharmD  PGY1 Pharmacy Resident

## 2023-02-09 DIAGNOSIS — I2609 Other pulmonary embolism with acute cor pulmonale: Secondary | ICD-10-CM | POA: Diagnosis not present

## 2023-02-09 DIAGNOSIS — I824Y2 Acute embolism and thrombosis of unspecified deep veins of left proximal lower extremity: Secondary | ICD-10-CM | POA: Diagnosis not present

## 2023-02-09 DIAGNOSIS — K219 Gastro-esophageal reflux disease without esophagitis: Secondary | ICD-10-CM | POA: Diagnosis not present

## 2023-02-09 DIAGNOSIS — J849 Interstitial pulmonary disease, unspecified: Secondary | ICD-10-CM | POA: Diagnosis not present

## 2023-02-09 LAB — CBC
HCT: 38.1 % — ABNORMAL LOW (ref 39.0–52.0)
Hemoglobin: 12.8 g/dL — ABNORMAL LOW (ref 13.0–17.0)
MCH: 31.1 pg (ref 26.0–34.0)
MCHC: 33.6 g/dL (ref 30.0–36.0)
MCV: 92.7 fL (ref 80.0–100.0)
Platelets: 305 10*3/uL (ref 150–400)
RBC: 4.11 MIL/uL — ABNORMAL LOW (ref 4.22–5.81)
RDW: 12.8 % (ref 11.5–15.5)
WBC: 7.2 10*3/uL (ref 4.0–10.5)
nRBC: 0 % (ref 0.0–0.2)

## 2023-02-09 LAB — HEPARIN LEVEL (UNFRACTIONATED): Heparin Unfractionated: 0.51 IU/mL (ref 0.30–0.70)

## 2023-02-09 LAB — APTT: aPTT: 102 seconds — ABNORMAL HIGH (ref 24–36)

## 2023-02-09 MED ORDER — ENOXAPARIN SODIUM 100 MG/ML IJ SOSY: 90.0000 mg | PREFILLED_SYRINGE | Freq: Two times a day (BID) | INTRAMUSCULAR | 1 refills | Status: AC

## 2023-02-09 MED ORDER — ENOXAPARIN SODIUM 100 MG/ML IJ SOSY
90.0000 mg | PREFILLED_SYRINGE | Freq: Two times a day (BID) | INTRAMUSCULAR | Status: DC
  Administered 2023-02-09: 90 mg via SUBCUTANEOUS
  Filled 2023-02-09 (×2): qty 0.9

## 2023-02-09 NOTE — Progress Notes (Signed)
ANTICOAGULATION CONSULT NOTE Pharmacy Consult for heparin to lovenox  Indication: pulmonary embolus and DVT  No Known Allergies  Patient Measurements:   Heparin Dosing Weight: 86Kg  Vital Signs: Temp: 98.1 F (36.7 C) (05/12 0447) BP: 134/97 (05/12 0447) Pulse Rate: 67 (05/12 0447)  Labs: Recent Labs    02/06/23 2351 02/07/23 0737 02/07/23 1533 02/08/23 0404 02/09/23 0456  HGB 14.1 13.2  --  13.1 12.8*  HCT 39.5 39.6  --  38.9* 38.1*  PLT 295 290  --  301 305  APTT  --   --  65* 86* 102*  LABPROT  --   --  17.5*  --   --   INR  --   --  1.4*  --   --   HEPARINUNFRC  --   --  >1.10* 0.87* 0.51  CREATININE 0.99 0.94  --  0.98  --   TROPONINIHS 3 3  --   --   --      Estimated Creatinine Clearance: 88.7 mL/min (by C-G formula based on SCr of 0.98 mg/dL).   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 male presenting to ED with increasing leg pain as well as chest pain and shortness of breath. Patient with history of prior PE in Feb 2024 on rivaroxaban prior to admission (Last dose 02/06/23 @2200 ). Dose recently increase to 15mg  BID following hematology consultation. Per discussion with EDP this is a true rivaroxaban failure and the plan will be to eventually transition to another anticoagulant. Pharmacy has been consulted to transition to therapeutic lovenox.   Patient currently on 1450 units/hr of heparin. No signs of symptoms of bleeding. Will start 90mg  BID of lovenox. Called RN and she is aware of new therapy instructions and will reach out with any questions or concerns.   Goal of Therapy:  Monitor platelets by anticoagulation protocol: Yes   Plan:  Stop  heparin  Start lovenox 90 mg BID   Monitor for signs and symptoms of bleeding    Blane Ohara, PharmD  PGY1 Pharmacy Resident

## 2023-02-09 NOTE — Discharge Summary (Signed)
Physician Discharge Summary  [name]. ZOX:[id number] DOB: 08/09/1966 DOA: 02/06/2023  PCP: Dione Housekeeper, MD  Admit date: 02/06/2023 Discharge date: 02/09/2023 Admitted From: Home Disposition: Home Recommendations for Outpatient Follow-up:  Follow up with PCP in 1 to 2 weeks Outpatient follow-up with hematology as previously planned Check CMP and CBC at follow-up Please follow up on the following pending results: None  Home Health: Not indicated Equipment/Devices: Not indicated  Discharge Condition: Stable CODE STATUS: Full code  Follow-up Information     Dione Housekeeper, MD. Schedule an appointment as soon as possible for a visit in 1 week(s).   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  Started on IV heparin.  Hematology, Dr. Mosetta Putt recommended IV heparin in house and discharged either on Lovenox or warfarin with Lovenox bridge.  Patient received IV heparin for 48 hours stenosis and discharged on Lovenox injection based on his preference.  On the day of discharge, pain significantly improved.  Still with significant notable swelling in LLE.  Follow-up with PCP in 1 to 2 weeks.  Follow-up with hematology as previously planned.  See individual problem list below for more.   Problems addressed during this hospitalization Principal Problem:   Acute pulmonary embolism (HCC) Active Problems:   Interstitial lung disease (HCC)   GERD (gastroesophageal reflux disease)   Acute deep vein thrombosis (DVT) of proximal end of left lower extremity (HCC)   Acute pulmonary embolism with acute cor pulmonale (HCC)              Time spent 35 minutes  Vital  signs Vitals:   02/08/23 1651 02/08/23 1938 02/09/23 0447 02/09/23 0800  BP: (!) 134/94 (!) 134/90 (!) 134/97 (!) 148/93  Pulse: 76 80 67 63  Temp: 98.4 F (36.9 C) 99 F (37.2 C) 98.1 F (36.7 C) 99.1 F (37.3 C)  Resp: 17 16 16 16   SpO2: 100% 100% 98% 100%  TempSrc: Oral   Oral     Discharge exam  GENERAL: No apparent distress.  Nontoxic. HEENT: MMM.  Vision and hearing grossly intact.  NECK: Supple.  No apparent JVD.  RESP:  No IWOB.  Fair aeration bilaterally. CVS:  RRR. Heart sounds normal.  ABD/GI/GU: BS+. Abd soft, NTND.  MSK/EXT:  Moves extremities.  Notable LLE swelling. SKIN: no apparent skin lesion or wound NEURO: Awake and alert. Oriented appropriately.  No apparent focal neuro deficit. PSYCH: Calm. Normal affect.   Discharge Instructions Discharge Instructions     Diet general   Complete by: As directed    Discharge instructions   Complete by: As directed    It has been a pleasure taking care of you!  You were hospitalized due to pulmonary embolism (blood clot in the lung) and deep venous thrombosis (blood clot in your leg).  We are discharging you on Lovenox injection until you follow-up with your primary care doctor and hematologist.  Stop Xarelto.  Please take your medications as prescribed.  Watch for signs of bleeding such as dark tarry stool or fresh red blood in stool.   Take care,   Increase activity slowly   Complete by: As directed  Allergies as of 02/09/2023   No Known Allergies      Medication List     STOP taking these medications    Rivaroxaban 15 MG Tabs tablet Commonly known as: XARELTO       TAKE these medications    acetaminophen 325 MG tablet Commonly known as: TYLENOL Take 325-650 mg by mouth every 6 (six) hours as needed for headache or mild pain.   albuterol (2.5 MG/3ML) 0.083% nebulizer solution Commonly known as: PROVENTIL Take 2.5 mg by nebulization every 6 (six) hours as needed for wheezing or shortness  of breath.   albuterol 108 (90 Base) MCG/ACT inhaler Commonly known as: VENTOLIN HFA Inhale 2 puffs into the lungs every 4 (four) hours as needed for wheezing or shortness of breath.   cyanocobalamin 1000 MCG tablet Commonly known as: VITAMIN B12 Take 1,000 mcg by mouth daily.   enoxaparin 100 MG/ML injection Commonly known as: LOVENOX Inject 0.9 mLs (90 mg total) into the skin every 12 (twelve) hours.   fluticasone-salmeterol 500-50 MCG/ACT Aepb Commonly known as: ADVAIR Inhale 1 puff into the lungs at bedtime.   PriLOSEC OTC 20 MG tablet Generic drug: omeprazole Take 20 mg by mouth at bedtime.        Consultations: Hematology over the phone  Procedures/Studies:   VAS [place] LOWER EXTREMITY VENOUS (DVT)  Result Date: 02/07/2023  Lower Venous DVT Study Patient Name:  Michael Alexander.  Date of Exam:   02/07/2023 Medical Rec #: [id number]           Accession #:    [id number] Date of Birth: 24-Oct-1965           Patient Gender: M Patient Age:   57 years Exam Location:  [facility] Procedure:      VAS [place] LOWER EXTREMITY VENOUS (DVT) Referring Phys: Cristal Deer POLLINA --------------------------------------------------------------------------------  Indications: Increasing pain in LLE, SOB, Hx of DVT in LLE., and pulmonary embolism.  Anticoagulation: Heparin. Comparison Study: Previous LLE 02/05/23 - age indeterminate DVT in popliteal                   vein. Performing Technologist: McKayla Maag RVT, [place]  Examination Guidelines: A complete evaluation includes B-mode imaging, spectral Doppler, color Doppler, and power Doppler as needed of all accessible portions of each vessel. Bilateral testing is considered an integral part of a complete examination. Limited examinations for reoccurring indications may be performed as noted. The reflux portion of the exam is performed with the patient in reverse Trendelenburg.  +-----+---------------+---------+-----------+----------+--------------+  RIGHTCompressibilityPhasicitySpontaneityPropertiesThrombus Aging +-----+---------------+---------+-----------+----------+--------------+ CFV  Full           Yes      Yes                                 +-----+---------------+---------+-----------+----------+--------------+ SFJ  Full                                                        +-----+---------------+---------+-----------+----------+--------------+   +---------+---------------+---------+-----------+----------+-----------------+ LEFT     CompressibilityPhasicitySpontaneityPropertiesThrombus Aging    +---------+---------------+---------+-----------+----------+-----------------+ CFV      Full           Yes      Yes                                    +---------+---------------+---------+-----------+----------+-----------------+  SFJ      Full                                                           +---------+---------------+---------+-----------+----------+-----------------+ FV Prox  Full                                                           +---------+---------------+---------+-----------+----------+-----------------+ FV Mid   Partial        Yes      Yes                  Acute             +---------+---------------+---------+-----------+----------+-----------------+ FV DistalPartial        Yes      Yes                  Acute             +---------+---------------+---------+-----------+----------+-----------------+ PFV      Full                                                           +---------+---------------+---------+-----------+----------+-----------------+ POP      Partial        Yes      Yes                  Age Indeterminate +---------+---------------+---------+-----------+----------+-----------------+ PTV      Full                                                           +---------+---------------+---------+-----------+----------+-----------------+ PERO      Full                                                           +---------+---------------+---------+-----------+----------+-----------------+    Summary: RIGHT: - No evidence of common femoral vein obstruction.  LEFT: - Findings consistent with acute deep vein thrombosis involving the left mid and distal femoral veins. - Findings consistent with age indeterminate deep vein thrombosis involving the left popliteal vein. - No cystic structure found in the popliteal fossa.  *See table(s) above for measurements and observations. Electronically signed by Heath Lark on 02/07/2023 at 1:46:55 PM.    Final    CT Angio Chest PE W and/or Wo Contrast  Result Date: 02/07/2023 CLINICAL DATA:  Lower leg pain with shortness of breath, initial encounter EXAM: CT ANGIOGRAPHY CHEST WITH CONTRAST TECHNIQUE: Multidetector CT imaging of the chest was performed using the standard protocol during bolus administration of intravenous contrast. Multiplanar CT image reconstructions and MIPs were  obtained to evaluate the vascular anatomy. RADIATION DOSE REDUCTION: This exam was performed according to the departmental dose-optimization program which includes automated exposure control, adjustment of the mA and/or kV according to patient size and/or use of iterative reconstruction technique. CONTRAST:  75mL OMNIPAQUE IOHEXOL 350 MG/ML SOLN COMPARISON:  Chest x-ray from earlier in the same day, CT from 11/10/2022 FINDINGS: Cardiovascular: Thoracic aorta shows no aneurysmal dilatation or dissection. No cardiac enlargement is seen. Scattered coronary calcifications are noted. Pulmonary artery shows a normal branching pattern. Scattered filling defects are identified within the pulmonary artery bilateral. The overall appearance is similar to that seen on the prior study predominately within the lower lobes. Changes of right heart strain are noted with elevation of the RV/LV ratio to 1.1. Mediastinum/Nodes: No sizable hilar or mediastinal  adenopathy is noted. The esophagus as visualized is within normal limits. Thoracic inlet is unremarkable. Persistent soft tissue density is noted in the anterior mediastinum which likely represents residual thymus. No mass effect is seen. Lungs/Pleura: Lungs demonstrates significant fibrotic disease predominately in the bases with honeycombing identified. No sizable parenchymal nodule is noted. No effusion is seen. Upper Abdomen: No acute abnormality. Musculoskeletal: No chest wall abnormality. No acute or significant osseous findings. Review of the MIP images confirms the above findings. IMPRESSION: Changes consistent with bilateral lower lobe pulmonary emboli. The overall appearance is similar to that seen on the prior exam. Findings of mild right heart strain are noted. Critical Value/emergent results were called by telephone at the time of interpretation on 02/07/2023 at 3:56 am to Dr. Jaci Carrel , who verbally acknowledged these results. Electronically Signed   By: Alcide Clever M.D.   On: 02/07/2023 03:58   DG Chest 2 View  Result Date: 02/07/2023 CLINICAL DATA:  Shortness of breath EXAM: CHEST - 2 VIEW COMPARISON:  None Available. FINDINGS: Cardiac shadow is within normal limits. Lungs are well aerated bilaterally. Mild bibasilar scarring is seen stable from the prior exam. No bony abnormality is noted. IMPRESSION: Chronic changes without acute abnormality. Electronically Signed   By: Alcide Clever M.D.   On: 02/07/2023 01:16   VAS [place] LOWER EXTREMITY VENOUS (DVT)  Result Date: 02/05/2023  Lower Venous DVT Study Patient Name:  Michael Alexander.  Date of Exam:   02/05/2023 Medical Rec #: [id number]           Accession #:    [id number] Date of Birth: 03/09/1966           Patient Gender: M Patient Age:   89 years Exam Location:  [facility] Procedure:      VAS [place] LOWER EXTREMITY VENOUS (DVT) Referring Phys: Maxwell Marion  --------------------------------------------------------------------------------  Indications: Left knee pain x 2 days, history of PE. Other Indications: History of PE - most recent CTA on 11-10-2022. Performing Technologist: Jean Rosenthal RDMS, RVT  Examination Guidelines: A complete evaluation includes B-mode imaging, spectral Doppler, color Doppler, and power Doppler as needed of all accessible portions of each vessel. Bilateral testing is considered an integral part of a complete examination. Limited examinations for reoccurring indications may be performed as noted. The reflux portion of the exam is performed with the patient in reverse Trendelenburg.  +-----+---------------+---------+-----------+----------+--------------+ RIGHTCompressibilityPhasicitySpontaneityPropertiesThrombus Aging +-----+---------------+---------+-----------+----------+--------------+ CFV  Full           Yes      Yes                                 +-----+---------------+---------+-----------+----------+--------------+   +---------+---------------+---------+-----------+----------+-----------------+  LEFT     CompressibilityPhasicitySpontaneityPropertiesThrombus Aging    +---------+---------------+---------+-----------+----------+-----------------+ CFV      Full           Yes      Yes                                    +---------+---------------+---------+-----------+----------+-----------------+ SFJ      Full                                                           +---------+---------------+---------+-----------+----------+-----------------+ FV Prox  Full                                                           +---------+---------------+---------+-----------+----------+-----------------+ FV Mid   Full                                                           +---------+---------------+---------+-----------+----------+-----------------+ FV DistalFull                                                            +---------+---------------+---------+-----------+----------+-----------------+ PFV      Full                                                           +---------+---------------+---------+-----------+----------+-----------------+ POP      Partial        Yes      Yes                  Age Indeterminate +---------+---------------+---------+-----------+----------+-----------------+ PTV      Full                                                           +---------+---------------+---------+-----------+----------+-----------------+ PERO     Full                                                           +---------+---------------+---------+-----------+----------+-----------------+ Gastroc  Full                               [place]                    +---------+---------------+---------+-----------+----------+-----------------+  Summary: RIGHT: - No evidence of common femoral vein obstruction.  LEFT: - Findings consistent with age indeterminate deep vein thrombosis involving the left popliteal vein. - No cystic structure found in the popliteal fossa.  *See table(s) above for measurements and observations. Electronically signed by Sherald Hess MD on 02/05/2023 at 10:58:41 AM.    Final        The results of significant diagnostics from this hospitalization (including imaging, microbiology, ancillary and laboratory) are listed below for reference.     Microbiology: No results found for this or any previous visit (from the past 240 hour(s)).   Labs:  CBC: Recent Labs  Lab 02/06/23 2351 02/07/23 0737 02/08/23 0404 02/09/23 0456  WBC 10.8* 9.7 9.7 7.2  HGB 14.1 13.2 13.1 12.8*  HCT 39.5 39.6 38.9* 38.1*  MCV 91.0 93.0 93.3 92.7  PLT 295 290 301 305   BMP &GFR Recent Labs  Lab 02/06/23 2351 02/07/23 0737 02/08/23 0404  NA 136 137 136  K 3.4* 3.2* 3.8  CL 98 97* 100  CO2 28 28 26   GLUCOSE 123* 100* 99  BUN 7 6 9   CREATININE  0.99 0.94 0.98  CALCIUM 9.2 9.1 9.0  MG  --  2.0 2.0  PHOS  --  4.7* 3.9   Estimated Creatinine Clearance: 88.7 mL/min (by C-G formula based on SCr of 0.98 mg/dL). Liver & Pancreas: Recent Labs  Lab 02/07/23 0737 02/08/23 0404  AST 16  --   ALT 14  --   ALKPHOS 68  --   BILITOT 0.7  --   PROT 7.0  --   ALBUMIN 3.1* 2.8*   No results for input(s): "LIPASE", "AMYLASE" in the last 168 hours. No results for input(s): "AMMONIA" in the last 168 hours. Diabetic: No results for input(s): "HGBA1C" in the last 72 hours. No results for input(s): "GLUCAP" in the last 168 hours. Cardiac Enzymes: No results for input(s): "CKTOTAL", "CKMB", "CKMBINDEX", "TROPONINI" in the last 168 hours. No results for input(s): "PROBNP" in the last 8760 hours. Coagulation Profile: Recent Labs  Lab 02/07/23 1533  INR 1.4*   Thyroid Function Tests: No results for input(s): "TSH", "T4TOTAL", "FREET4", "T3FREE", "THYROIDAB" in the last 72 hours. Lipid Profile: No results for input(s): "CHOL", "HDL", "LDLCALC", "TRIG", "CHOLHDL", "LDLDIRECT" in the last 72 hours. Anemia Panel: No results for input(s): "VITAMINB12", "FOLATE", "FERRITIN", "TIBC", "IRON", "RETICCTPCT" in the last 72 hours. Urine analysis: No results found for: "COLORURINE", "APPEARANCEUR", "LABSPEC", "PHURINE", "GLUCOSEU", "HGBUR", "BILIRUBINUR", "KETONESUR", "PROTEINUR", "UROBILINOGEN", "NITRITE", "LEUKOCYTESUR" Sepsis Labs: Invalid input(s): "PROCALCITONIN", "LACTICIDVEN"   SIGNED:  Almon Hercules, MD  [facility] 02/09/2023, 2:54 PM

## 2024-09-13 NOTE — Progress Notes (Signed)
 " Chief Complaint  Patient presents with   Hypertension    Subjective  Michael Alexander is a 58 y.o. male who presents for Hypertension HPI History of Present Illness Michael Alexander is a 58 year old male with a history of blood clots and B12 deficiency who presents with headaches and numbness in his feet.  He has been experiencing headaches for the past week, with elevated blood pressure noted two weeks ago. He initially used ibuprofen for relief, to note he is under anticoagulation therapy with Pradaxa.  He has a history of hypertension, BP Readings from Last 3 Encounters:  09/13/24 (!) 141/91  08/30/24 (!) 152/89  07/12/24 126/84   He describes persistent numbness and tingling in his feet, a symptom he has experienced before. He underwent a neurological evaluation several years ago, including nerve conduction studies, but does not recall the results. He takes an over-the-counter B12 supplement but is unsure of the dosage. His B12 levels were normal when last checked in May.  He has a history of blood clots that began in his legs and later affected his lungs. He is currently on Pradaxa for anticoagulation but has had difficulty obtaining the medication from the pharmacy at times.  He works third shift at a tobacco factory, which disrupts his sleep schedule, though he sleeps well on weekends. He has been told he has sleep apnea.  Patient reports a history of B12 deficiency.  Most recent blood work shows that his B12 levels were out of normal and acceptable range.  He is also complaining that recently because of cough he has developed pain on the right lower side of his ribs.  Denies any trauma to it.  To note he is on anticoagulation with Pradaxa.  He has recently changed his diet and exercise habits after noticing consistently high blood pressure readings. He has lost weight in the past and is considering further lifestyle modifications to manage his elevated cholesterol levels.  With a  history of hyperlipidemia.  So far he has declined the use of a statin No results found for: CHOL Lab Results  Component Value Date   HDL 59 02/26/2024   HDL 42 07/02/2022   HDL 49 11/27/2021   Lab Results  Component Value Date   LDLCALC 149 (H) 02/26/2024   LDLCALC 133 07/02/2022   LDLCALC 136 11/27/2021   Lab Results  Component Value Date   TRIG 104 02/26/2024   TRIG 152 07/02/2022   TRIG 112 11/27/2021  He is concerned about cardiovascular disease.   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   Multiple subsegmental pulmonary emboli without acute cor pulmonale   NSIP (nonspecific interstitial pneumonitis) (CMS/HHS-HCC)   Blood coagulation disorder (HHS-HCC)   Influenza due to influenza A virus with upper respiratory signs   Local infection of skin and subcutaneous tissue    Outpatient Medications Prior to Visit  Medication Sig Dispense Refill   albuterol  (PROVENTIL ) 2.5 mg /3 mL (0.083 %) nebulizer solution Take 3 mLs (  2.5 mg total) by nebulization every 6 (six) hours as needed for Wheezing or Shortness of Breath for up to 14 days 75 mL 1   albuterol  MDI, PROVENTIL , VENTOLIN , PROAIR , HFA 90 mcg/actuation inhaler Inhale 2 inhalations into the lungs every 4 (four) hours as needed for Wheezing 6.7 each 2   ascorbic acid (VITAMIN C) 100 MG tablet 1 tab by mouth daily     cholecalciferol (VITAMIN D3) 1000 unit capsule Take 1,000 Units by mouth once daily     cyanocobalamin  (VITAMIN B12) 1000 MCG tablet Take 1 tablet (1,000 mcg total) by mouth once daily 100 tablet 3   dabigatran (PRADAXA) 150 mg capsule Take 1 capsule (150 mg total) by mouth 2 (two) times daily for 360 days 180 capsule 3   fluticasone propion-salmeteroL (ADVAIR DISKUS) 500-50 mcg/dose diskus inhaler Inhale 1 Puff into the lungs every 12 (twelve) hours 180 each 4   fluticasone propionate (FLONASE) 50 mcg/actuation nasal spray SPRAY 2 SPRAYS INTO EACH NOSTRIL EVERY DAY 16 g 2   multivitamin tablet Take 1 tablet by mouth once daily     omeprazole  (PRILOSEC) 40 MG DR capsule Take 1 capsule (40 mg total) by mouth once daily 30 capsule 5   traZODone  (DESYREL ) 50 MG tablet Take 1 tablet (50 mg total) by mouth at bedtime as needed for Sleep 30 tablet 3   TURMERIC ORAL Take by mouth once daily     zinc 50 mg Tab Take by mouth once daily     No facility-administered medications prior to visit.      Objective  Vitals:   09/13/24 0958  BP: (!) 141/91  Pulse: 83  Weight: 98 kg (216 lb)  PainSc: 0-No pain   Body mass index is 33.12 kg/m.  Home Vitals:     Physical Exam Physical Exam MEASUREMENTS: Weight- 200. CHEST: Musculoskeletal tenderness in chest wall.  Constitutional: alert, obese, in NAD, and communicates well Eye exam: pupils equal and reactive, extraocular eye movements intact. Neck: supple Respiratory: No respiratory distress Cardiovascular: regular rate and rhythm Lower extremities: no lower extremity edema Skin ankles/feet: warm, good capillary refill Neurological: sensorimotor grossly intact, normal muscle tone, and cranial nerves II through XII without any focal abnormalities.  Results LABS Hemoglobin A1c: within normal range Vitamin B12: within normal range (01/2024)     Assessment/Plan:   Assessment & Plan Chronic cluster headache Chronic headaches I suspect to be cluster headaches. Neurological exam is unremarkable, ruling out stroke. Possible link to  elevated blood pressure or sleep apnea. Ibuprofen use may have contributed to elevated blood pressure and increased bleeding risk due to anticoagulation therapy. - Discontinued ibuprofen due to risk of bleeding with anticoagulation therapy. - Use acetaminophen  1000 mg three times a day for headache management. - Monitor blood pressure and headache correlation.  Peripheral polyneuropathy.  B12 deficiency Chronic numbness and tingling in feet, possibly related to B12 deficiency or diabetic neuropathy. Previous neurological evaluation was inconclusive. B12 levels were normal in May. - Continue B12 supplementation at 1000 mcg daily. - Start alpha lipoic acid supplement for nerve pain.  Rib pain Patient complaining of right lower rib pain.  On palpation this was confirmed.  May use acetaminophen  for discomfort, ice compresses.  Essential hypertension Recent episodes of elevated blood pressure. Possible link to headache or sleep apnea. Lifestyle factors such as diet and exercise may contribute to blood pressure control. - Monitor blood pressure regularly. - Encouraged lifestyle modifications including diet and exercise.  Mixed hyperlipidemia Chronic high  cholesterol levels. Discussed potential benefits of statin therapy versus lifestyle modifications. He prefers lifestyle changes initially. High cholesterol contributes to plaque formation and increased risk of cardiovascular events. - Encouraged lifestyle modifications including diet and exercise. - Scheduled follow-up appointment in February to reassess cholesterol levels.  Recurrent deep venous thrombosis.  Pulmonary embolus Currently on dabigatran (Pradaxa) for anticoagulation. Advised against ibuprofen due to increased bleeding risk. - Continue dabigatran (Pradaxa) as prescribed. - Avoid ibuprofen due to bleeding risk.  B12 deficiency Previous B12 deficiency with normal levels in May. - Continue B12 supplementation at 1000 mcg  daily.  Rib pain Intermittent rib pain likely musculoskeletal, possibly related to coughing. No signs of acute cardiac or pulmonary issues. Diagnoses and all orders for this visit:  Essential hypertension  Chronic cluster headache, not intractable  B12 deficiency  Peripheral polyneuropathy  Rib pain  Multiple subsegmental pulmonary emboli without acute cor pulmonale  Recurrent deep venous thrombosis, unspecified laterality (CMS/HHS-HCC)  Hyperlipidemia, mixed    This visit was coded based on medical decision making (MDM).           Future Appointments     Date/Time Provider Department Center Visit Type   10/21/2024 10:00 AM (Arrive by 9:45 AM) Eliverto Bette Hover, MD [facility] OFFICE VISIT   11/18/2024 11:00 AM (Arrive by 10:30 AM) Viviann Piggs, MD [facility] RETURN VISIT   11/25/2024 9:00 AM (Arrive by 8:45 AM) Eliverto Bette Hover, MD [facility] OFFICE VISIT       There are no Patient Instructions on file for this visit.  An after visit summary was provided for the patient either in written format (printed) or through [facility].  This note has been created using automated tools and reviewed for accuracy by MARIO E OLMEDO.  "

## 2024-09-17 ENCOUNTER — Encounter (HOSPITAL_COMMUNITY): Payer: Self-pay | Admitting: Emergency Medicine

## 2024-09-17 ENCOUNTER — Other Ambulatory Visit: Payer: Self-pay

## 2024-09-17 ENCOUNTER — Emergency Department (HOSPITAL_COMMUNITY)

## 2024-09-17 ENCOUNTER — Emergency Department (HOSPITAL_COMMUNITY)
Admission: EM | Admit: 2024-09-17 | Discharge: 2024-09-17 | Disposition: A | Discharge status: Home / Self Care | Attending: Emergency Medicine | Admitting: Emergency Medicine

## 2024-09-17 DIAGNOSIS — R030 Elevated blood-pressure reading, without diagnosis of hypertension: Secondary | ICD-10-CM | POA: Insufficient documentation

## 2024-09-17 DIAGNOSIS — R1011 Right upper quadrant pain: Secondary | ICD-10-CM | POA: Diagnosis present

## 2024-09-17 DIAGNOSIS — I2699 Other pulmonary embolism without acute cor pulmonale: Secondary | ICD-10-CM | POA: Diagnosis not present

## 2024-09-17 DIAGNOSIS — R519 Headache, unspecified: Secondary | ICD-10-CM | POA: Diagnosis not present

## 2024-09-17 DIAGNOSIS — Z7901 Long term (current) use of anticoagulants: Secondary | ICD-10-CM | POA: Insufficient documentation

## 2024-09-17 DIAGNOSIS — R1012 Left upper quadrant pain: Secondary | ICD-10-CM | POA: Diagnosis not present

## 2024-09-17 DIAGNOSIS — R1010 Upper abdominal pain, unspecified: Secondary | ICD-10-CM

## 2024-09-17 LAB — URINALYSIS, ROUTINE W REFLEX MICROSCOPIC
Bilirubin Urine: NEGATIVE
Glucose, UA: NEGATIVE mg/dL
Ketones, ur: NEGATIVE mg/dL
Leukocytes,Ua: NEGATIVE
Nitrite: NEGATIVE
Protein, ur: NEGATIVE mg/dL
Specific Gravity, Urine: <1.005 — ABNORMAL LOW (ref 1.005–1.030)
pH: 7 (ref 5.0–8.0)

## 2024-09-17 LAB — COMPREHENSIVE METABOLIC PANEL WITH GFR
ALT: 14 U/L (ref 0–44)
AST: 25 U/L (ref 15–41)
Albumin: 4.4 g/dL (ref 3.5–5.0)
Alkaline Phosphatase: 98 U/L (ref 38–126)
Anion gap: 12 (ref 5–15)
BUN: 14 mg/dL (ref 6–20)
CO2: 26 mmol/L (ref 22–32)
Calcium: 9.9 mg/dL (ref 8.9–10.3)
Chloride: 99 mmol/L (ref 98–111)
Creatinine, Ser: 1.36 mg/dL — ABNORMAL HIGH (ref 0.61–1.24)
GFR, Estimated: >60 mL/min
Glucose, Bld: 109 mg/dL — ABNORMAL HIGH (ref 70–99)
Potassium: 4.2 mmol/L (ref 3.5–5.1)
Sodium: 136 mmol/L (ref 135–145)
Total Bilirubin: 0.5 mg/dL (ref 0.0–1.2)
Total Protein: 7.9 g/dL (ref 6.5–8.1)

## 2024-09-17 LAB — CBC
HCT: 44.5 % (ref 39.0–52.0)
Hemoglobin: 15.1 g/dL (ref 13.0–17.0)
MCH: 30.9 pg (ref 26.0–34.0)
MCHC: 33.9 g/dL (ref 30.0–36.0)
MCV: 91.2 fL (ref 80.0–100.0)
Platelets: 338 K/uL (ref 150–400)
RBC: 4.88 MIL/uL (ref 4.22–5.81)
RDW: 12.7 % (ref 11.5–15.5)
WBC: 6.6 K/uL (ref 4.0–10.5)
nRBC: 0 % (ref 0.0–0.2)

## 2024-09-17 LAB — URINALYSIS, MICROSCOPIC (REFLEX)

## 2024-09-17 LAB — LIPASE, BLOOD: Lipase: 24 U/L (ref 11–51)

## 2024-09-17 MED ORDER — PANTOPRAZOLE SODIUM 20 MG PO TBEC: 20.0000 mg | DELAYED_RELEASE_TABLET | Freq: Every day | ORAL | 0 refills | Status: AC

## 2024-09-17 MED ORDER — DIPHENHYDRAMINE HCL 50 MG/ML IJ SOLN
12.5000 mg | Freq: Once | INTRAMUSCULAR | Status: AC
  Administered 2024-09-17: 12.5 mg via INTRAVENOUS
  Filled 2024-09-17: qty 1

## 2024-09-17 MED ORDER — DEXAMETHASONE SOD PHOSPHATE PF 10 MG/ML IJ SOLN
10.0000 mg | Freq: Once | INTRAMUSCULAR | Status: AC
  Administered 2024-09-17: 10 mg via INTRAVENOUS

## 2024-09-17 MED ORDER — IOHEXOL 350 MG/ML SOLN
75.0000 mL | Freq: Once | INTRAVENOUS | Status: AC | PRN
  Administered 2024-09-17: 75 mL via INTRAVENOUS

## 2024-09-17 MED ORDER — GADOBUTROL 1 MMOL/ML IV SOLN
8.0000 mL | Freq: Once | INTRAVENOUS | Status: AC | PRN
  Administered 2024-09-17: 8 mL via INTRAVENOUS

## 2024-09-17 MED ORDER — METOCLOPRAMIDE HCL 5 MG/ML IJ SOLN
5.0000 mg | Freq: Once | INTRAMUSCULAR | Status: AC
  Administered 2024-09-17: 5 mg via INTRAVENOUS
  Filled 2024-09-17: qty 2

## 2024-09-17 NOTE — ED Provider Notes (Cosign Needed)
 " [place] EMERGENCY DEPARTMENT AT [facility] Provider Note   CSN: [id number] Arrival date & time: 09/17/24  9843     Patient presents with: Abdominal Pain   Michael Alexander. is a 58 y.o. male patient with past medical history of GERD, PE on Pradaxa, sarcoidosis presenting to ER with complaint of HA for 2.5 weeks. Reports they are daily and wrap around the whole head R>L. Reports he has had some blurry vision with this, no confusion, focal weakness/tingling. No injury/fall. Saw PCP for this, but he reports it is not getting better. Reports HTN, trying diet and exercise. No CP, SOB.  Also reports that for the last 2 days he has had pain abdominal pain in RUQ > LUQ and associated with belching and nausea, he has had some loose stool with this. No prior abdominal surgeries. Has not tried anything at home.     Abdominal Pain      Prior to Admission medications  Medication Sig Start Date End Date Taking? Authorizing Provider  acetaminophen  (TYLENOL ) 325 MG tablet Take 325-650 mg by mouth every 6 (six) hours as needed for headache or mild pain.    [provider]  albuterol  (PROVENTIL ) (2.5 MG/3ML) 0.083% nebulizer solution Take 2.5 mg by nebulization every 6 (six) hours as needed for wheezing or shortness of breath.    [provider]  albuterol  (VENTOLIN  HFA) 108 (90 Base) MCG/ACT inhaler Inhale 2 puffs into the lungs every 4 (four) hours as needed for wheezing or shortness of breath. 12/20/21   Armenta Canning, MD  cyanocobalamin (VITAMIN B12) 1000 MCG tablet Take 1,000 mcg by mouth daily.    [provider]  enoxaparin  (LOVENOX ) 100 MG/ML injection Inject 0.9 mLs (90 mg total) into the skin every 12 (twelve) hours. 02/09/23 04/10/23  Gonfa, Taye T, MD  fluticasone-salmeterol (ADVAIR) 500-50 MCG/ACT AEPB Inhale 1 puff into the lungs at bedtime. 01/20/20   [provider]  PRILOSEC OTC 20 MG tablet Take 20 mg by mouth at bedtime.    [provider]    Allergies: Patient has no known allergies.    Review of Systems  Gastrointestinal:  Positive for abdominal pain.    Updated Vital Signs BP (!) 142/97 (BP Location: Right Arm)   Pulse 69   Temp 98.1 F (36.7 C)   Resp 17   SpO2 98%   Physical Exam Vitals and nursing note reviewed.  Constitutional:      General: He is not in acute distress.    Appearance: He is not toxic-appearing.  HENT:     Head: Normocephalic and atraumatic.  Eyes:     General: No scleral icterus.    Conjunctiva/sclera: Conjunctivae normal.  Cardiovascular:     Rate and Rhythm: Normal rate and regular rhythm.     Pulses: Normal pulses.     Heart sounds: Normal heart sounds.  Pulmonary:     Effort: Pulmonary effort is normal. No respiratory distress.     Breath sounds: Normal breath sounds.  Abdominal:     General: Abdomen is flat. Bowel sounds are normal.     Palpations: Abdomen is soft.     Tenderness: There is abdominal tenderness in the right upper quadrant, epigastric area and left upper quadrant.  Skin:    General: Skin is warm and dry.     Findings: No lesion.  Neurological:     General: No focal deficit present.     Mental Status: He is alert and oriented  to person, place, and time. Mental status is at baseline.     Comments: No focal deficit on exam.      (all labs ordered are listed, but only abnormal results are displayed) Labs Reviewed  COMPREHENSIVE METABOLIC PANEL WITH GFR - Abnormal; Notable for the following components:      Result Value   Glucose, Bld 109 (*)    Creatinine, Ser 1.36 (*)    All other components within normal limits  URINALYSIS, ROUTINE W REFLEX MICROSCOPIC - Abnormal; Notable for the following components:   Specific Gravity, Urine <1.005 (*)    Hgb urine dipstick TRACE (*)    All other components within normal limits  URINALYSIS, MICROSCOPIC (REFLEX) - Abnormal; Notable for the following components:   Bacteria, UA RARE (*)    All other  components within normal limits  LIPASE, BLOOD  CBC    EKG: None  Radiology: No results found.   Procedures   Medications Ordered in the ED  metoCLOPramide  (REGLAN ) injection 5 mg (has no administration in time range)  diphenhydrAMINE (BENADRYL) injection 12.5 mg (has no administration in time range)  dexamethasone (DECADRON) injection 10 mg (has no administration in time range)    Clinical Course as of 09/17/24 0952  Fri Sep 17, 2024  0853 Re-evaluated patient, feeling better after migraine cocktail here.  [JB]  [phone] Will need MRA head/neck for rule out of aneurysm per head CT [JB]    Clinical Course User Index [JB] Tyesha Joffe, Warren SAILOR, PA-C                                 Medical Decision Making Amount and/or Complexity of Data Reviewed Labs: ordered. Radiology: ordered.  Risk Prescription drug management.   This patient presents to the ED for concern of abdominal pain, this involves an extensive number of treatment options, and is a complaint that carries with it a high risk of complications and morbidity.  The differential diagnosis includes cholecystitis, AAA, appendicitis, renal stone, UTI   Co morbidities that complicate the patient evaluation  GERD, PE on Lovenox , sarcoidosis   Additional history obtained:  Additional history obtained from 09/13/24 saw PCP for similar complaint on concern for high BP and HA   Lab Tests:  I personally interpreted labs.  The pertinent results include:   CBC, CMP, Lipase, UA unremarkable.   Imaging Studies ordered:  I ordered imaging studies including CT head due to recurrent headaches, CT abd/pelvis  I independently visualized and interpreted imaging which showed no acute findings in abdomen and pelvis, ground glass irregularities in the lower lung bases likely secondary to patient's sarcoidosis, no acute intercranial hemorrhage or mass effect, somewhat bulbous configuration of ACA possible to represent aneurysm  recommending MRA.  MRI angio of the head and neck ordered no acute findings.   I agree with the radiologist interpretation    Problem List / ED Course / Critical interventions / Medication management  Patient reports to emergency room with 2 complaints.  He primarily reports that he has been having headache for 2-1/2 weeks.  He reports this is new onset and he has no focal neurological deficit on exam.  He has reported some mild blurry vision but no visual field defects.  Pupils equal and reactive and no nystagmus.  He denies any sudden onset or severe headache.  He has no meningeal signs and no fever with this.  Will obtain head CT to  rule out acute findings.   Has been seen with PCP for elevated BP readings, no sign of hypertensive emergency, will continue current plan with no changes.  Patient is also concerned with 2 days of upper abdominal pain that used to be worse on left side and now worse on the right side.  He has some nausea with this but he is tolerating oral intake.  He is having bowel movements.  No focal tenderness in right upper quadrant, negative Murphy sign, negative McBurney's point.  Given severity of symptoms will obtain CT scan of abdomen and pelvis to rule out acute findings.  CT scan of abdomen and pelvis shows no acute findings question if this is related to gastritis/GERD will start on Protonix.  I ordered medication including migraine cocktail.  Reevaluation of the patient after these medicines showed that the patient resolved I have reviewed the patients home medicines and have made adjustments as needed      Final diagnoses:  Upper abdominal pain  Bad headache  Elevated blood pressure reading    ED Discharge Orders          Ordered    pantoprazole (PROTONIX) 20 MG tablet  Daily        09/17/24 0956               Shermon Warren SAILOR, PA-C 09/17/24 1359  "

## 2024-09-17 NOTE — ED Triage Notes (Addendum)
 Patient reports intermittent spasms  tight across abdomen this week , denies emesis or fever .

## 2024-09-17 NOTE — Discharge Instructions (Signed)
 Please try Protonix daily as prescribed.  Avoid excessive NSAID use, avoid alcohol use.  Try bland foods/GERD diet.  Try to avoid aggravating foods if you suspect there is a trigger. CT abd/pelvis is reassuring today. Follow up with PCP.

## 2024-09-17 NOTE — ED Notes (Signed)
 Patient remains in MRI
